# Patient Record
Sex: Female | Born: 1985 | Race: White | Hispanic: No | Marital: Married | Smoking: Current some day smoker
Health system: Southern US, Community
[De-identification: ages and names within clinical notes are randomized; demographics above are authoritative.]

## PROBLEM LIST (undated history)

## (undated) DIAGNOSIS — J189 Pneumonia, unspecified organism: Secondary | ICD-10-CM

## (undated) DIAGNOSIS — J45909 Unspecified asthma, uncomplicated: Secondary | ICD-10-CM

## (undated) HISTORY — PX: CERVICAL CERCLAGE: SHX1329

## (undated) HISTORY — PX: TONSILLECTOMY: SUR1361

## (undated) HISTORY — PX: OTHER SURGICAL HISTORY: SHX169

## (undated) HISTORY — PX: DILATION AND CURETTAGE OF UTERUS: SHX78

---
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
## 2005-12-06 ENCOUNTER — Emergency Department (HOSPITAL_COMMUNITY): Admission: EM | Admit: 2005-12-06 | Discharge: 2005-12-06 | Payer: Self-pay | Admitting: Emergency Medicine

## 2006-11-14 ENCOUNTER — Emergency Department (HOSPITAL_COMMUNITY): Admission: EM | Admit: 2006-11-14 | Discharge: 2006-11-15 | Payer: Self-pay | Admitting: Emergency Medicine

## 2007-11-18 IMAGING — CR DG CHEST 2V
2 series · 2 of 2 positions shown · non-contrast
Comparison: 12/06/05.

CLINICAL DATA: Cough.  Fever.  Upper respiratory infection.
 CHEST - 2 VIEW:

[view not recorded (1 of 2)]
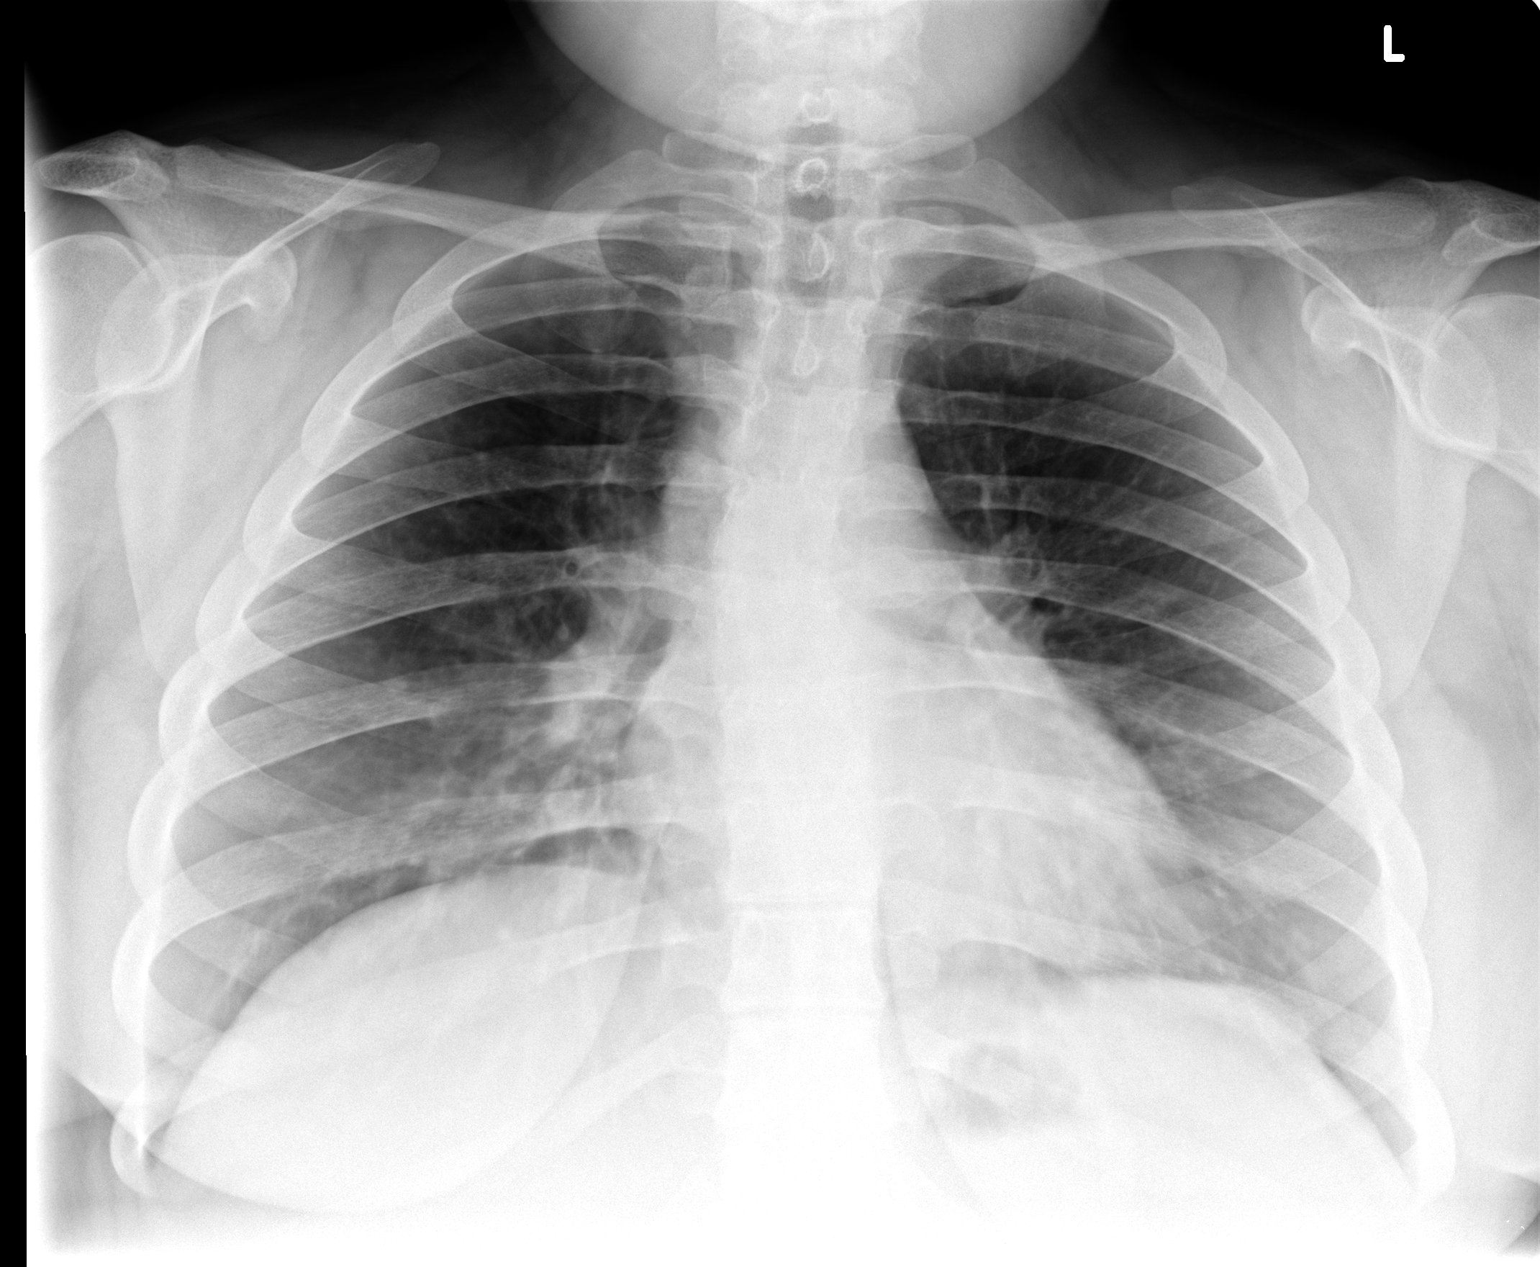

[view not recorded (2 of 2)]
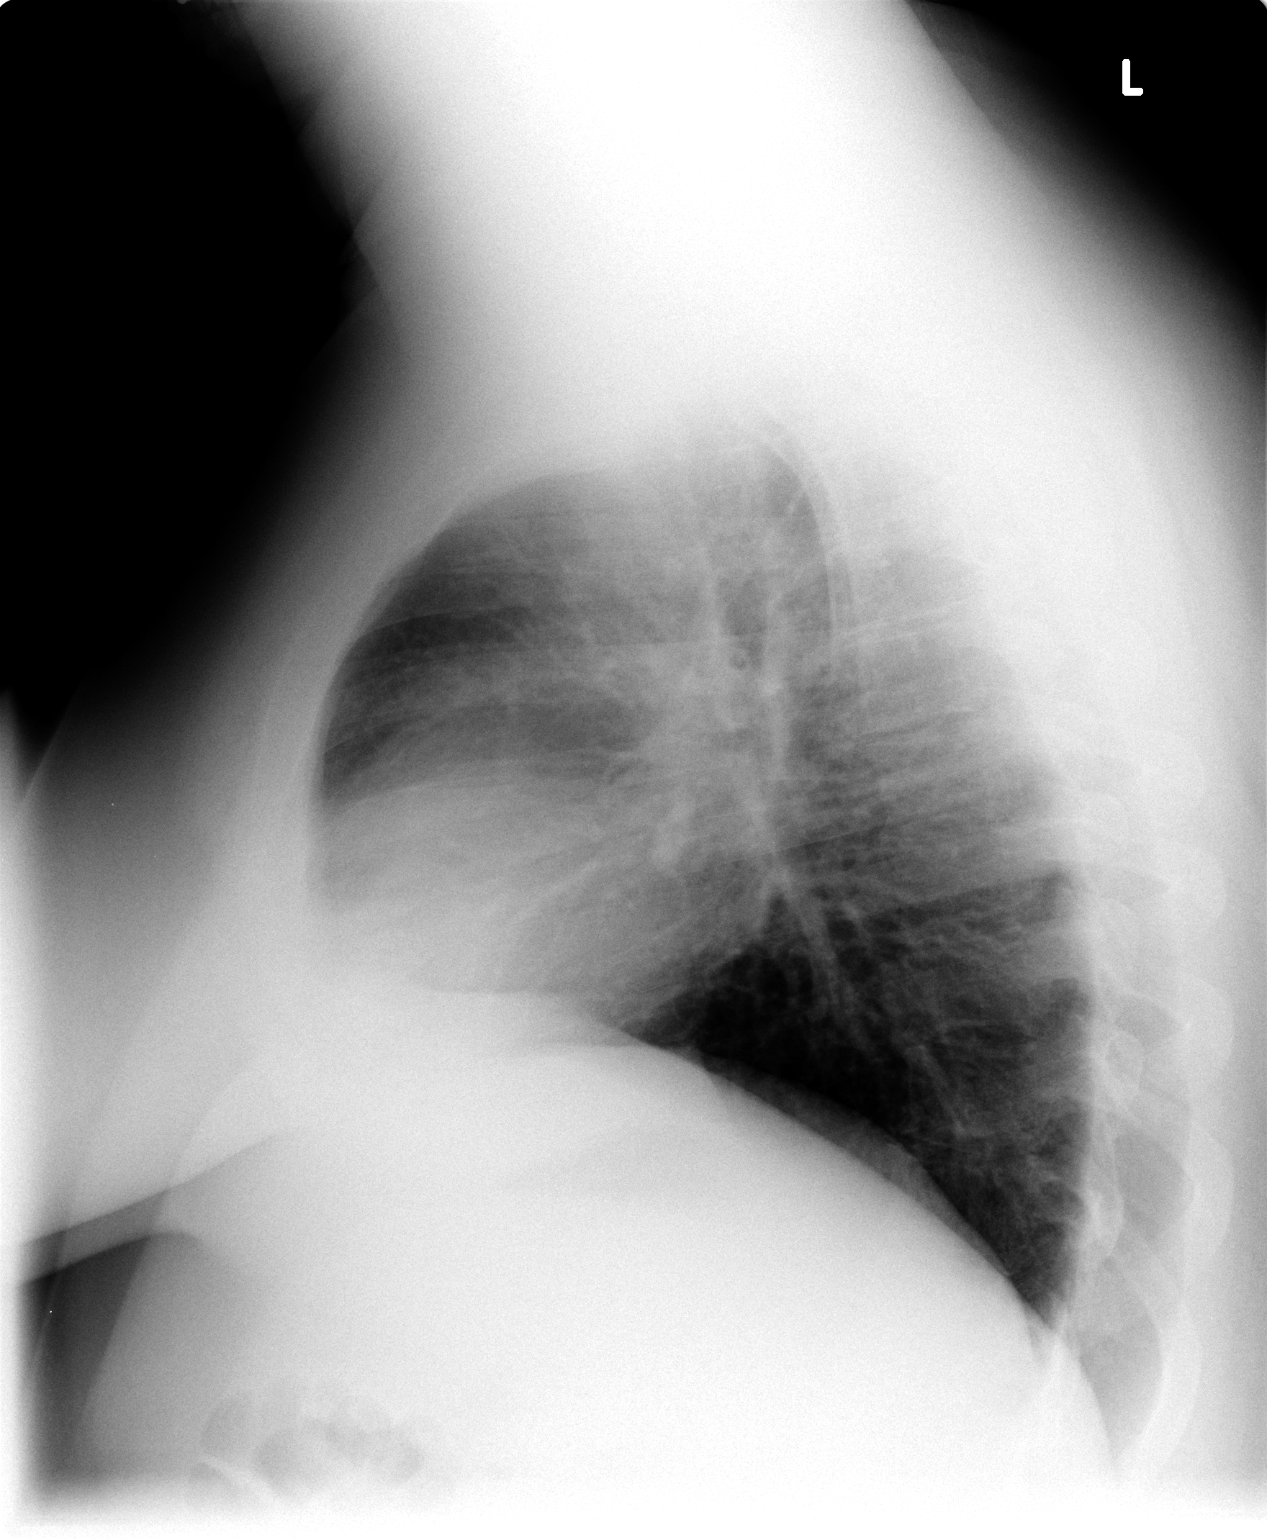

[2 of 2 positions shown; findings below may reference images not displayed]

FINDINGS: Central peribronchial thickening remains unchanged.  There is no evidence of acute infiltrate or pleural effusion.  Heart size and mediastinal contours are normal.
IMPRESSION: Chronic peribronchial thickening.  No active disease.

## 2012-10-02 ENCOUNTER — Emergency Department (HOSPITAL_COMMUNITY): Payer: BC Managed Care – PPO

## 2012-10-02 ENCOUNTER — Inpatient Hospital Stay (HOSPITAL_COMMUNITY)
Admission: EM | Admit: 2012-10-02 | Discharge: 2012-10-04 | DRG: 089 | Disposition: A | Discharge status: Home / Self Care | Payer: BC Managed Care – PPO | Attending: Internal Medicine | Admitting: Internal Medicine

## 2012-10-02 ENCOUNTER — Encounter (HOSPITAL_COMMUNITY): Payer: Self-pay | Admitting: *Deleted

## 2012-10-02 DIAGNOSIS — R0902 Hypoxemia: Secondary | ICD-10-CM | POA: Diagnosis present

## 2012-10-02 DIAGNOSIS — Z720 Tobacco use: Secondary | ICD-10-CM | POA: Diagnosis present

## 2012-10-02 DIAGNOSIS — E876 Hypokalemia: Secondary | ICD-10-CM | POA: Diagnosis present

## 2012-10-02 DIAGNOSIS — J45901 Unspecified asthma with (acute) exacerbation: Secondary | ICD-10-CM | POA: Diagnosis present

## 2012-10-02 DIAGNOSIS — F17200 Nicotine dependence, unspecified, uncomplicated: Secondary | ICD-10-CM | POA: Diagnosis present

## 2012-10-02 DIAGNOSIS — Z79899 Other long term (current) drug therapy: Secondary | ICD-10-CM

## 2012-10-02 DIAGNOSIS — J189 Pneumonia, unspecified organism: Principal | ICD-10-CM | POA: Diagnosis present

## 2012-10-02 HISTORY — DX: Unspecified asthma, uncomplicated: J45.909

## 2012-10-02 LAB — BASIC METABOLIC PANEL
BUN: 12 mg/dL (ref 6–23)
CO2: 26 mEq/L (ref 19–32)
Calcium: 9.6 mg/dL (ref 8.4–10.5)
Chloride: 97 mEq/L (ref 96–112)
Creatinine, Ser: 0.76 mg/dL (ref 0.50–1.10)
GFR calc Af Amer: >90 mL/min (ref >90)
GFR calc non Af Amer: >90 mL/min (ref >90)
Glucose, Bld: 162 mg/dL — ABNORMAL HIGH (ref 70–99)
Potassium: 3.2 mEq/L — ABNORMAL LOW (ref 3.5–5.1)
Sodium: 134 mEq/L — ABNORMAL LOW (ref 135–145)

## 2012-10-02 LAB — CBC WITH DIFFERENTIAL/PLATELET
Basophils Absolute: 0 10*3/uL (ref 0.0–0.1)
Basophils Relative: 0 % (ref 0–1)
Eosinophils Absolute: 0 10*3/uL (ref 0.0–0.7)
Eosinophils Relative: 1 % (ref 0–5)
HCT: 39.6 % (ref 36.0–46.0)
Hemoglobin: 13.7 g/dL (ref 12.0–15.0)
Lymphocytes Relative: 8 % — ABNORMAL LOW (ref 12–46)
Lymphs Abs: 0.7 10*3/uL (ref 0.7–4.0)
MCH: 31.2 pg (ref 26.0–34.0)
MCHC: 34.6 g/dL (ref 30.0–36.0)
MCV: 90.2 fL (ref 78.0–100.0)
Monocytes Absolute: 0.2 10*3/uL (ref 0.1–1.0)
Monocytes Relative: 2 % — ABNORMAL LOW (ref 3–12)
Neutro Abs: 7.7 10*3/uL (ref 1.7–7.7)
Neutrophils Relative %: 89 % — ABNORMAL HIGH (ref 43–77)
Platelets: 192 10*3/uL (ref 150–400)
RBC: 4.39 MIL/uL (ref 3.87–5.11)
RDW: 12.5 % (ref 11.5–15.5)
WBC: 8.6 10*3/uL (ref 4.0–10.5)

## 2012-10-02 MED ORDER — PREDNISONE 50 MG PO TABS
60.0000 mg | ORAL_TABLET | Freq: Once | ORAL | Status: AC
  Administered 2012-10-02: 60 mg via ORAL
  Filled 2012-10-02: qty 1

## 2012-10-02 MED ORDER — ALBUTEROL SULFATE (5 MG/ML) 0.5% IN NEBU: 2.5000 mg | INHALATION_SOLUTION | Freq: Once | RESPIRATORY_TRACT | Status: DC

## 2012-10-02 MED ORDER — SODIUM CHLORIDE 0.9 % IV BOLUS (SEPSIS)
1000.0000 mL | Freq: Once | INTRAVENOUS | Status: AC
  Administered 2012-10-02: 1000 mL via INTRAVENOUS

## 2012-10-02 MED ORDER — ONDANSETRON 4 MG PO TBDP
ORAL_TABLET | ORAL | Status: AC
  Administered 2012-10-02: 4 mg
  Filled 2012-10-02: qty 1

## 2012-10-02 MED ORDER — IPRATROPIUM BROMIDE 0.02 % IN SOLN
0.5000 mg | Freq: Once | RESPIRATORY_TRACT | Status: AC
  Administered 2012-10-02: 0.5 mg via RESPIRATORY_TRACT
  Filled 2012-10-02: qty 2.5

## 2012-10-02 MED ORDER — SODIUM CHLORIDE 0.9 % IV BOLUS (SEPSIS)
1000.0000 mL | Freq: Once | INTRAVENOUS | Status: DC
  Administered 2012-10-03: 1000 mL via INTRAVENOUS

## 2012-10-02 MED ORDER — PANTOPRAZOLE SODIUM 40 MG IV SOLR
40.0000 mg | Freq: Once | INTRAVENOUS | Status: AC
  Administered 2012-10-02: 40 mg via INTRAVENOUS
  Filled 2012-10-02: qty 40

## 2012-10-02 MED ORDER — AZITHROMYCIN 250 MG PO TABS
500.0000 mg | ORAL_TABLET | Freq: Once | ORAL | Status: AC
  Administered 2012-10-02: 500 mg via ORAL
  Filled 2012-10-02: qty 2

## 2012-10-02 MED ORDER — ALBUTEROL SULFATE (5 MG/ML) 0.5% IN NEBU
5.0000 mg | INHALATION_SOLUTION | Freq: Once | RESPIRATORY_TRACT | Status: AC
  Administered 2012-10-02: 5 mg via RESPIRATORY_TRACT
  Filled 2012-10-02: qty 1

## 2012-10-02 MED ORDER — DEXTROSE 5 % IV SOLN
1.0000 g | Freq: Once | INTRAVENOUS | Status: AC
  Administered 2012-10-02: 1 g via INTRAVENOUS
  Filled 2012-10-02: qty 10

## 2012-10-02 MED ORDER — ONDANSETRON HCL 4 MG/2ML IJ SOLN
4.0000 mg | Freq: Once | INTRAMUSCULAR | Status: AC
  Administered 2012-10-02: 4 mg via INTRAVENOUS
  Filled 2012-10-02: qty 2

## 2012-10-02 MED ORDER — ALBUTEROL SULFATE (5 MG/ML) 0.5% IN NEBU
10.0000 mg | INHALATION_SOLUTION | Freq: Once | RESPIRATORY_TRACT | Status: AC
  Administered 2012-10-02: 10 mg via RESPIRATORY_TRACT
  Filled 2012-10-02: qty 2

## 2012-10-02 NOTE — ED Notes (Signed)
Pt states nvd for over 1 week. Also notes cough and congestion for same period. Pt just finished 3rd nebulizer treatment and states moderate relief. Denies nausea at this time and state she in comfortable

## 2012-10-02 NOTE — ED Notes (Signed)
Vomiting, diarreha, cough, wheeze,  Has been using HHN  .

## 2012-10-02 NOTE — ED Notes (Signed)
Resp therapy contacted to give breathing treatment

## 2012-10-02 NOTE — ED Provider Notes (Signed)
History     CSN: [id number]  Arrival date & time 10/02/12  1511   First MD Initiated Contact with Patient 10/02/12 1740      Chief Complaint  Patient presents with  . Emesis     Patient is a 26 y.o. female presenting with cough. The history is provided by the patient.  Cough This is a new problem. Episode onset: several days ago. The problem occurs hourly. The problem has been gradually worsening. The cough is non-productive. Associated symptoms include chills, myalgias, shortness of breath and wheezing.  pt presents for cough, wheezing and shortness of breath She reports post tussive emesis She also reports nonbloody diarrhea as well No abd pain.   She reports chills associated with cough  Past Medical History  Diagnosis Date  . Asthma     Past Surgical History  Procedure Date  . Tonsillectomy   . Dilitation anc curet     History reviewed. No pertinent family history.  History  Substance Use Topics  . Smoking status: Former Games developer  . Smokeless tobacco: Not on file  . Alcohol Use: No    OB History    Grav Para Term Preterm Abortions TAB SAB Ect Mult Living                  Review of Systems  Constitutional: Positive for chills.  Respiratory: Positive for cough, shortness of breath and wheezing.   Gastrointestinal: Negative for abdominal pain.  Genitourinary: Negative for dysuria.  Musculoskeletal: Positive for myalgias.  Neurological: Negative for weakness.  Psychiatric/Behavioral: Negative for agitation.  All other systems reviewed and are negative.    Allergies  Review of patient's allergies indicates no known allergies.  Home Medications   Current Outpatient Rx  Name  Route  Sig  Dispense  Refill  . ALBUTEROL SULFATE (2.5 MG/3ML) 0.083% IN NEBU   Nebulization   Take 2.5 mg by nebulization every 6 (six) hours as needed. For cough and cold symptoms         . CLOMIPHENE CITRATE 50 MG PO TABS   Oral   Take 100 mg by mouth daily. On days 3-7  of menstrual cycle.         Marland Kitchen PHENYLEPH-CPM-DM-APAP 02-23-09-250 MG PO TBEF   Oral   Take 1 tablet by mouth daily as needed. For cold and flu symptoms           BP 129/87  Pulse 89  Temp 97.9 F (36.6 C) (Oral)  Resp 20  Ht 5\' 9"  (1.753 m)  Wt 225 lb (102.059 kg)  BMI 33.23 kg/m2  SpO2 92%  LMP 09/29/2012  Physical Exam CONSTITUTIONAL: Well developed/well nourished HEAD AND FACE: Normocephalic/atraumatic EYES: EOMI/PERRL, no icterus ENMT: Mucous membranes moist NECK: supple no meningeal signs SPINE:entire spine nontender CV: S1/S2 noted, no murmurs/rubs/gallops noted LUNGS: coarse wheezing noted bilaterally, but no distress and able to speak to me clearly ABDOMEN: soft, nontender, no rebound or guarding GU:no cva tenderness NEURO: Pt is awake/alert, moves all extremitiesx4 EXTREMITIES: pulses normal, full ROM SKIN: warm, color normal PSYCH: no abnormalities of mood noted  ED Course  Procedures   6:45 PM Primary issue appears to be wheezing/asthma.  Will treat with nebs/prednisone and reassess  9:32 PM Pt has had multiple treatments but still hypoxic (mid 80s) on room air She is in no distress, but will need further workup/nebs She still has diffuse coarse nebs on exam She has not had any vomiting/diarrhea here 9:50 PM At signout, will  need to have reassessment and check labs If no improvement with nebs, will need admission for pneumonia (community acquired)  MDM  Nursing notes including past medical history and social history reviewed and considered in documentation xrays reviewed and considered         Joya Gaskins, MD 10/02/12 2150

## 2012-10-03 ENCOUNTER — Encounter (HOSPITAL_COMMUNITY): Payer: Self-pay | Admitting: Internal Medicine

## 2012-10-03 DIAGNOSIS — F17200 Nicotine dependence, unspecified, uncomplicated: Secondary | ICD-10-CM

## 2012-10-03 DIAGNOSIS — J189 Pneumonia, unspecified organism: Secondary | ICD-10-CM | POA: Diagnosis present

## 2012-10-03 DIAGNOSIS — R0902 Hypoxemia: Secondary | ICD-10-CM | POA: Diagnosis present

## 2012-10-03 DIAGNOSIS — J45901 Unspecified asthma with (acute) exacerbation: Secondary | ICD-10-CM | POA: Diagnosis present

## 2012-10-03 DIAGNOSIS — Z720 Tobacco use: Secondary | ICD-10-CM | POA: Diagnosis present

## 2012-10-03 DIAGNOSIS — E876 Hypokalemia: Secondary | ICD-10-CM | POA: Diagnosis present

## 2012-10-03 LAB — GLUCOSE, CAPILLARY
Glucose-Capillary: 165 mg/dL — ABNORMAL HIGH (ref 70–99)
Glucose-Capillary: 155 mg/dL — ABNORMAL HIGH (ref 70–99)
Glucose-Capillary: 132 mg/dL — ABNORMAL HIGH (ref 70–99)

## 2012-10-03 LAB — COMPREHENSIVE METABOLIC PANEL
ALT: 10 U/L (ref 0–35)
AST: 11 U/L (ref 0–37)
Albumin: 3.3 g/dL — ABNORMAL LOW (ref 3.5–5.2)
Alkaline Phosphatase: 68 U/L (ref 39–117)
BUN: 12 mg/dL (ref 6–23)
CO2: 22 mEq/L (ref 19–32)
Calcium: 8.6 mg/dL (ref 8.4–10.5)
Chloride: 104 mEq/L (ref 96–112)
Creatinine, Ser: 0.65 mg/dL (ref 0.50–1.10)
GFR calc Af Amer: >90 mL/min (ref >90)
GFR calc non Af Amer: >90 mL/min (ref >90)
Glucose, Bld: 220 mg/dL — ABNORMAL HIGH (ref 70–99)
Potassium: 4.4 mEq/L (ref 3.5–5.1)
Sodium: 137 mEq/L (ref 135–145)
Total Bilirubin: 0.2 mg/dL — ABNORMAL LOW (ref 0.3–1.2)
Total Protein: 7.4 g/dL (ref 6.0–8.3)

## 2012-10-03 LAB — HIV ANTIBODY (ROUTINE TESTING W REFLEX): HIV: NONREACTIVE

## 2012-10-03 LAB — CBC
HCT: 36.6 % (ref 36.0–46.0)
Hemoglobin: 12.5 g/dL (ref 12.0–15.0)
MCH: 30.9 pg (ref 26.0–34.0)
MCHC: 34.2 g/dL (ref 30.0–36.0)
MCV: 90.4 fL (ref 78.0–100.0)
Platelets: 212 10*3/uL (ref 150–400)
RBC: 4.05 MIL/uL (ref 3.87–5.11)
RDW: 12.6 % (ref 11.5–15.5)
WBC: 8.7 10*3/uL (ref 4.0–10.5)

## 2012-10-03 LAB — MAGNESIUM: Magnesium: 1.8 mg/dL (ref 1.5–2.5)

## 2012-10-03 LAB — STREP PNEUMONIAE URINARY ANTIGEN: Strep Pneumo Urinary Antigen: NEGATIVE

## 2012-10-03 LAB — PREGNANCY, URINE: Preg Test, Ur: NEGATIVE

## 2012-10-03 MED ORDER — POTASSIUM CHLORIDE CRYS ER 20 MEQ PO TBCR
40.0000 meq | EXTENDED_RELEASE_TABLET | Freq: Once | ORAL | Status: AC
  Administered 2012-10-03: 40 meq via ORAL
  Filled 2012-10-03: qty 2

## 2012-10-03 MED ORDER — ALBUTEROL SULFATE (5 MG/ML) 0.5% IN NEBU: 2.5000 mg | INHALATION_SOLUTION | RESPIRATORY_TRACT | Status: DC | PRN

## 2012-10-03 MED ORDER — PNEUMOCOCCAL VAC POLYVALENT 25 MCG/0.5ML IJ INJ
0.5000 mL | INJECTION | INTRAMUSCULAR | Status: AC
  Administered 2012-10-04: 0.5 mL via INTRAMUSCULAR
  Filled 2012-10-03: qty 0.5

## 2012-10-03 MED ORDER — INFLUENZA VIRUS VACC SPLIT PF IM SUSP
0.5000 mL | INTRAMUSCULAR | Status: AC
  Administered 2012-10-04: 0.5 mL via INTRAMUSCULAR
  Filled 2012-10-03: qty 0.5

## 2012-10-03 MED ORDER — ENOXAPARIN SODIUM 40 MG/0.4ML ~~LOC~~ SOLN
40.0000 mg | SUBCUTANEOUS | Status: DC
  Administered 2012-10-03 – 2012-10-04 (×2): 40 mg via SUBCUTANEOUS
  Filled 2012-10-03 (×2): qty 0.4

## 2012-10-03 MED ORDER — DEXTROSE 5 % IV SOLN
1.0000 g | INTRAVENOUS | Status: DC
  Administered 2012-10-03: 1 g via INTRAVENOUS
  Filled 2012-10-03 (×2): qty 10

## 2012-10-03 MED ORDER — ONDANSETRON HCL 4 MG/2ML IJ SOLN
4.0000 mg | Freq: Four times a day (QID) | INTRAMUSCULAR | Status: DC | PRN
  Administered 2012-10-03: 4 mg via INTRAVENOUS
  Filled 2012-10-03: qty 2

## 2012-10-03 MED ORDER — OXYCODONE HCL 5 MG PO TABS
5.0000 mg | ORAL_TABLET | ORAL | Status: DC | PRN
  Administered 2012-10-03 (×2): 5 mg via ORAL
  Filled 2012-10-03 (×2): qty 1

## 2012-10-03 MED ORDER — NICOTINE 14 MG/24HR TD PT24
14.0000 mg | MEDICATED_PATCH | Freq: Every day | TRANSDERMAL | Status: DC
  Administered 2012-10-03: 14 mg via TRANSDERMAL
  Filled 2012-10-03 (×2): qty 1

## 2012-10-03 MED ORDER — ONDANSETRON HCL 4 MG PO TABS: 4.0000 mg | ORAL_TABLET | Freq: Four times a day (QID) | ORAL | Status: DC | PRN

## 2012-10-03 MED ORDER — METHYLPREDNISOLONE SODIUM SUCC 125 MG IJ SOLR
80.0000 mg | Freq: Two times a day (BID) | INTRAMUSCULAR | Status: DC
  Administered 2012-10-03 – 2012-10-04 (×2): 80 mg via INTRAVENOUS
  Filled 2012-10-03 (×2): qty 2

## 2012-10-03 MED ORDER — AZITHROMYCIN 500 MG IV SOLR
500.0000 mg | INTRAVENOUS | Status: DC
  Administered 2012-10-03: 500 mg via INTRAVENOUS
  Filled 2012-10-03 (×2): qty 500

## 2012-10-03 MED ORDER — ACETAMINOPHEN 650 MG RE SUPP: 650.0000 mg | Freq: Four times a day (QID) | RECTAL | Status: DC | PRN

## 2012-10-03 MED ORDER — POTASSIUM CHLORIDE IN NACL 20-0.9 MEQ/L-% IV SOLN
INTRAVENOUS | Status: AC
  Administered 2012-10-03: 02:00:00 via INTRAVENOUS

## 2012-10-03 MED ORDER — CLOMIPHENE CITRATE 50 MG PO TABS
50.0000 mg | ORAL_TABLET | ORAL | Status: DC
  Administered 2012-10-04: 50 mg via ORAL

## 2012-10-03 MED ORDER — ACETAMINOPHEN 325 MG PO TABS
650.0000 mg | ORAL_TABLET | Freq: Four times a day (QID) | ORAL | Status: DC | PRN
  Administered 2012-10-03: 650 mg via ORAL
  Filled 2012-10-03: qty 2

## 2012-10-03 MED ORDER — ALBUTEROL SULFATE (5 MG/ML) 0.5% IN NEBU
2.5000 mg | INHALATION_SOLUTION | RESPIRATORY_TRACT | Status: DC
  Administered 2012-10-03 – 2012-10-04 (×9): 2.5 mg via RESPIRATORY_TRACT
  Filled 2012-10-03 (×10): qty 0.5

## 2012-10-03 MED ORDER — IBUPROFEN 800 MG PO TABS
800.0000 mg | ORAL_TABLET | Freq: Once | ORAL | Status: AC
  Administered 2012-10-03: 800 mg via ORAL
  Filled 2012-10-03: qty 1

## 2012-10-03 MED ORDER — METHYLPREDNISOLONE SODIUM SUCC 125 MG IJ SOLR
80.0000 mg | Freq: Four times a day (QID) | INTRAMUSCULAR | Status: DC
  Administered 2012-10-03: 12:00:00 via INTRAVENOUS
  Administered 2012-10-03 (×2): 80 mg via INTRAVENOUS
  Filled 2012-10-03 (×3): qty 2

## 2012-10-03 MED ORDER — IBUPROFEN 600 MG PO TABS
600.0000 mg | ORAL_TABLET | Freq: Three times a day (TID) | ORAL | Status: DC | PRN
  Administered 2012-10-03 – 2012-10-04 (×2): 600 mg via ORAL
  Filled 2012-10-03 (×2): qty 1

## 2012-10-03 NOTE — ED Notes (Signed)
Dr Rito Ehrlich in room with patient at this time

## 2012-10-03 NOTE — Progress Notes (Signed)
UR Chart Review Completed  

## 2012-10-03 NOTE — Progress Notes (Signed)
Antibiotic doses reviewed for renal function; Pt is currently on Rocephin & Zithromax for CAP.  Neither of these antibiotics require renal dose adjustment. Pharmacy will sign off.  Please re-consult as needed.  Thank you, Junita Push, PharmD, BCPS

## 2012-10-03 NOTE — Progress Notes (Signed)
Inpatient Diabetes Program Recommendations  AACE/ADA: New Consensus Statement on Inpatient Glycemic Control (2013)  Target Ranges:  Prepandial:   less than 140 mg/dL      Peak postprandial:   less than 180 mg/dL (1-2 hours)      Critically ill patients:  140 - 180 mg/dL   Results for OVIDA, DELAGARZA (MRN [id number]) as of 10/03/2012 08:59  Ref. Range 10/02/2012 21:40 10/03/2012 05:53  Glucose Latest Range: 70-99 mg/dL 409 (H) 811 (H)    Inpatient Diabetes Program Recommendations Correction (SSI): Please consider checking CBGs ACHS and ordering Novolog moderate correction scale since patient is receiving IV steroids. HgbA1C: Please consider ordering an A1C on patient.  Patient has no history of diabetes but blood glucose is elevated likely due to steroids.  Would have better idea of average blood glucose prior to admission if A1C is obtained. Diet: May want to consider changing diet to carb modified.  Note: Patient does not have a documented history of diabetes. However, blood glucose has been elevated since being admitted which is likely due to steroids.  Please consider ordering an A1C and CBGs ACHS with Novolog moderate correction scale while patient is on steroids.  Will continue to follow.  Thanks, Orlando Penner, RN, BSN, CCRN Diabetes Coordinator Inpatient Diabetes Program [phone]

## 2012-10-03 NOTE — Progress Notes (Signed)
Nutrition Brief Note  Patient identified on the Malnutrition Screening Tool (MST) Report  Body mass index is 34.25 kg/(m^2). Pt meets criteria for Obesity Class II based on current BMI.   Current diet order is Regular, patient is consuming approximately 75-100% of meals at this time. Labs and medications reviewed.   No nutrition interventions warranted at this time. If nutrition issues arise, please consult RD.   #[phone]

## 2012-10-03 NOTE — H&P (Addendum)
[facility] History and Physical  Kristie Dixon UJW:[id number] DOB: 04/21/1986 DOA: 10/02/2012   PCP: Does not have a PCP Specialists: None  Chief Complaint: Shortness of breath, cough, and wheezing. For the last 5 days  HPI: Kristie Dixon is a 26 y.o. female with a past medical history of asthma BCD tobacco abuse, who was in her usual state of health till Thursday, when she started having cough and wheezing. Initially, it was whitish expectoration and currently, she has yellowish expectoration. The symptoms were also associated with chest pain, which would get worse with cough. The pain was 8/10 in intensity. She also felt like she had a fever on Sunday, but did not check her temperature. She also had chills. Denies any dizziness. Has been wheezing profusely and has been short of breath. And, then yesterday she had multiple episodes of diarrhea and vomiting, but none today. Because her symptoms were not getting any better she decided to come in to the hospital. She currently has her menstrual cycle. She used to be an albuterol inhaler and nebulizers. However, she has run out of them. Denies any sick contacts. No recent travel.  Home Medications: Prior to Admission medications   Medication Sig Start Date End Date Taking? Authorizing Provider  albuterol (PROVENTIL) (2.5 MG/3ML) 0.083% nebulizer solution Take 2.5 mg by nebulization every 6 (six) hours as needed. For cough and cold symptoms   Yes Historical Provider, MD  clomiPHENE (CLOMID) 50 MG tablet Take 100 mg by mouth daily. On days 3-7 of menstrual cycle.   Yes Historical Provider, MD  Phenyleph-CPM-DM-APAP (ALKA-SELTZER PLUS COLD & FLU) 02-23-09-250 MG TBEF Take 1 tablet by mouth daily as needed. For cold and flu symptoms   Yes Historical Provider, MD    Allergies: No Known Allergies  Past Medical History: Past Medical History  Diagnosis Date  . Asthma     Past Surgical History  Procedure Date  . Tonsillectomy   .  Dilitation anc curet   . Dilation and curettage of uterus     Social History:  reports that she has been smoking.  She does not have any smokeless tobacco history on file. She reports that she does not drink alcohol or use illicit drugs.  Living Situation: Lives with her husband Activity Level: Independent with her daily activities   Family History:  Family History  Problem Relation Age of Onset  . Depression Mother      Review of Systems - History obtained from the patient General ROS: positive for  - fatigue Psychological ROS: negative Ophthalmic ROS: negative ENT ROS: negative Allergy and Immunology ROS: negative Hematological and Lymphatic ROS: negative Endocrine ROS: negative Respiratory ROS: as in hpi Cardiovascular ROS: as in hpi Gastrointestinal ROS: as in hpi Genito-Urinary ROS: no dysuria, trouble voiding, or hematuria Musculoskeletal ROS: negative Neurological ROS: no TIA or stroke symptoms Dermatological ROS: negative  Physical Examination  Filed Vitals:   10/02/12 1901 10/02/12 2149 10/02/12 2200 10/02/12 2319  BP:  95/62    Pulse:  87  103  Temp:  98.3 F (36.8 C)    TempSrc:  Oral    Resp:  20  19  Height:      Weight:      SpO2: 93% 94% 96% 94%    General appearance: alert, cooperative, appears stated age, no distress and moderately obese Head: Normocephalic, without obvious abnormality, atraumatic Eyes: conjunctivae/corneas clear. PERRL, EOM's intact.  Throat: lips, mucosa, and tongue normal; teeth and gums normal Neck: no  adenopathy, no carotid bruit, no JVD, supple, symmetrical, trachea midline and thyroid not enlarged, symmetric, no tenderness/mass/nodules Back: symmetric, no curvature. ROM normal. No CVA tenderness. Resp: Diffuse wheezing bilaterally. Crackles present with a few rhonchi on the right side. Cardio: S1-S2 is mildly tachycardic, regular no S3, S4. No rubs, murmurs, or bruits. No pedal edema GI: soft, non-tender; bowel sounds  normal; no masses,  no organomegaly Extremities: extremities normal, atraumatic, no cyanosis or edema Pulses: 2+ and symmetric Skin: Skin color, texture, turgor normal. No rashes or lesions Lymph nodes: Cervical, supraclavicular, and axillary nodes normal. Neurologic: Alert and oriented X 3, normal strength and tone. Normal symmetric reflexes. Normal coordination and gait  Laboratory Data: Results for orders placed during the hospital encounter of 10/02/12 (from the past 48 hour(s))  BASIC METABOLIC PANEL     Status: Abnormal   Collection Time   10/02/12  9:40 PM      Component Value Range Comment   Sodium 134 (*) 135 - 145 mEq/L    Potassium 3.2 (*) 3.5 - 5.1 mEq/L    Chloride 97  96 - 112 mEq/L    CO2 26  19 - 32 mEq/L    Glucose, Bld 162 (*) 70 - 99 mg/dL    BUN 12  6 - 23 mg/dL    Creatinine, Ser 1.61  0.50 - 1.10 mg/dL    Calcium 9.6  8.4 - 09.6 mg/dL    GFR calc non Af Amer >90  >90 mL/min    GFR calc Af Amer >90  >90 mL/min   CBC WITH DIFFERENTIAL     Status: Abnormal   Collection Time   10/02/12  9:40 PM      Component Value Range Comment   WBC 8.6  4.0 - 10.5 K/uL    RBC 4.39  3.87 - 5.11 MIL/uL    Hemoglobin 13.7  12.0 - 15.0 g/dL    HCT 04.5  40.9 - 81.1 %    MCV 90.2  78.0 - 100.0 fL    MCH 31.2  26.0 - 34.0 pg    MCHC 34.6  30.0 - 36.0 g/dL    RDW 91.4  78.2 - 95.6 %    Platelets 192  150 - 400 K/uL    Neutrophils Relative 89 (*) 43 - 77 %    Neutro Abs 7.7  1.7 - 7.7 K/uL    Lymphocytes Relative 8 (*) 12 - 46 %    Lymphs Abs 0.7  0.7 - 4.0 K/uL    Monocytes Relative 2 (*) 3 - 12 %    Monocytes Absolute 0.2  0.1 - 1.0 K/uL    Eosinophils Relative 1  0 - 5 %    Eosinophils Absolute 0.0  0.0 - 0.7 K/uL    Basophils Relative 0  0 - 1 %    Basophils Absolute 0.0  0.0 - 0.1 K/uL     Radiology Reports: Dg Chest Port 1 View  10/02/2012  *RADIOLOGY REPORT*  Clinical Data: Short of breath.  Asthma.  PORTABLE CHEST - 1 VIEW  Comparison: 11/15/2006, 12/06/2005.   Findings: There is a new focus of opacity in the right upper lobe with overlying the right rib end.  While this could represent summation shadows, this is suspicious for a small focus of pneumonia.  Scarring is present in the medial right lung base, not appreciably changed compared to prior examinations.  The cardiopericardial silhouette appears within normal limits.  IMPRESSION:  1.  New right apical  density suspicious for small focus of pneumonia. 2.  Chronic medial right basilar scarring/atelectasis.   Original Report Authenticated By: Andreas Newport, M.D.     Problem List  Principal Problem:  *CAP (community acquired pneumonia) Active Problems:  Acute asthma exacerbation  Hypokalemia  Tobacco abuse  Hypoxia   Assessment: This is a 26 year old, Caucasian female, with a past medical history of asthma, who, unfortunately smokes one pack of cigarettes on a daily basis, who presents with a few day history of cough, wheezing, shortness of breath. She's found to have possible pneumonia in the right apex. She also has acute asthma exacerbation and has been hypoxic with room air saturations in 80's.  Plan: #1 community acquired pneumonia with hypoxia: She'll be treated with ceftriaxone and azithromycin. Oxygen will be provided.  #2 acute asthma exacerbation: Peak flows will be checked. She'll maintain on nebulizer treatments and steroids. CBGs will be monitored closely.  #3 tobacco abuse: Smoking cessation counseling will be provided. Nicotine patch will be utilized.  #4 hypokalemia: This will be repleted. Magnesium level will be checked.  We'll also check urine pregnancy test. Episodes of diarrhea and vomiting could be related to a viral syndrome. She doesn't have the symptoms currently. We'll monitor her for recurrence.  Further management decisions will depend on results of further testing and patient's response to treatment.  Code Status: She is a full code Family Communication: Her  mother was at the, bedside  Disposition Plan: Will likely return home in improved   [facility]  [facility] Pager [phone]  If 7PM-7AM, please contact night-coverage [email or web address] Password [email or web address]  10/03/2012, 12:08 AM

## 2012-10-03 NOTE — Progress Notes (Signed)
[facility]             Progress Note   Subjective: SOB has improved.  Objective: Vital signs in last 24 hours: Temp:  [97.7 F (36.5 C)-98.3 F (36.8 C)] 97.8 F (36.6 C) (12/10 1345) Pulse Rate:  [87-126] 98  (12/10 1345) Resp:  [19-26] 20  (12/10 1345) BP: (95-129)/(54-87) 115/54 mmHg (12/10 1345) SpO2:  [92 %-99 %] 95 % (12/10 1345) FiO2 (%):  [21 %] 21 % (12/10 1210) Weight:  [84.823 kg (187 lb)-105.2 kg (231 lb 14.8 oz)] 105.2 kg (231 lb 14.8 oz) (12/10 0110) Weight change:  Last BM Date: 10/01/12  Intake/Output from previous day: 12/09 0701 - 12/10 0700 In: 518.3 [I.V.:518.3] Out: -  Total I/O In: 480 [P.O.:480] Out: -    Physical Exam: General: Alert, awake, oriented x3. HEENT: No bruits, no goiter. Heart: Regular rate and rhythm, without murmurs, rubs, gallops. Lungs: Coarse bilateral breath sounds, no wheezing. Abdomen: Soft, nontender, nondistended, positive bowel sounds. Extremities: No clubbing cyanosis or edema with positive pedal pulses. Neuro: Grossly intact, nonfocal.    Lab Results: Basic Metabolic Panel:  Basename 10/03/12 0553 10/02/12 2140  NA 137 134*  K 4.4 3.2*  CL 104 97  CO2 22 26  GLUCOSE 220* 162*  BUN 12 12  CREATININE 0.65 0.76  CALCIUM 8.6 9.6  MG 1.8 --  PHOS -- --   Liver Function Tests:  [facility] 10/03/12 0553  AST 11  ALT 10  ALKPHOS 68  BILITOT 0.2*  PROT 7.4  ALBUMIN 3.3*   CBC:  Basename 10/03/12 0553 10/02/12 2140  WBC 8.7 8.6  NEUTROABS -- 7.7  HGB 12.5 13.7  HCT 36.6 39.6  MCV 90.4 90.2  PLT 212 192   CBG:  Basename 10/03/12 1119 10/03/12 0726  GLUCAP 155* 165*    Studies/Results: Dg Chest Port 1 View  10/02/2012  *RADIOLOGY REPORT*  Clinical Data: Short of breath.  Asthma.  PORTABLE CHEST - 1 VIEW  Comparison: 11/15/2006, 12/06/2005.  Findings: There is a new focus of opacity in the right upper lobe with overlying the right rib end.  While this could represent summation  shadows, this is suspicious for a small focus of pneumonia.  Scarring is present in the medial right lung base, not appreciably changed compared to prior examinations.  The cardiopericardial silhouette appears within normal limits.  IMPRESSION:  1.  New right apical density suspicious for small focus of pneumonia. 2.  Chronic medial right basilar scarring/atelectasis.   Original Report Authenticated By: Andreas Newport, M.D.     Medications: Scheduled Meds:   . [COMPLETED] albuterol  10 mg Nebulization Once  . albuterol  2.5 mg Nebulization Q4H  . [COMPLETED] albuterol  5 mg Nebulization Once  . [COMPLETED] albuterol  5 mg Nebulization Once  . azithromycin  500 mg Intravenous Q24H  . [COMPLETED] azithromycin  500 mg Oral Once  . [COMPLETED] cefTRIAXone (ROCEPHIN)  IV  1 g Intravenous Once  . cefTRIAXone (ROCEPHIN)  IV  1 g Intravenous Q24H  . clomiPHENE  50 mg Oral Q24H  . enoxaparin (LOVENOX) injection  40 mg Subcutaneous Q24H  . [COMPLETED] ibuprofen  800 mg Oral Once  . [COMPLETED] ipratropium  0.5 mg Nebulization Once  . methylPREDNISolone (SOLU-MEDROL) injection  80 mg Intravenous Q12H  . nicotine  14 mg Transdermal Daily  . [COMPLETED] ondansetron  4 mg Intravenous Once  . [COMPLETED] ondansetron      . [COMPLETED] pantoprazole (PROTONIX) IV  40 mg  Intravenous Once  . [COMPLETED] potassium chloride  40 mEq Oral Once  . [COMPLETED] predniSONE  60 mg Oral Once  . [COMPLETED] sodium chloride  1,000 mL Intravenous Once  . [COMPLETED] sodium chloride  1,000 mL Intravenous Once  . [DISCONTINUED] albuterol  2.5 mg Nebulization Once  . [DISCONTINUED] methylPREDNISolone (SOLU-MEDROL) injection  80 mg Intravenous Q6H  . [COMPLETED] sodium chloride  1,000 mL Intravenous Once   Continuous Infusions:   . [EXPIRED] 0.9 % NaCl with KCl 20 mEq / L 100 mL/hr at 10/03/12 0131   PRN Meds:.acetaminophen, acetaminophen, albuterol, ondansetron (ZOFRAN) IV, ondansetron,  oxyCODONE  Assessment/Plan:  Principal Problem:  *CAP (community acquired pneumonia) Active Problems:  Acute asthma exacerbation  Hypokalemia  Tobacco abuse  Hypoxia    CAP -Continue current antibiotics. -Can DC on levaquin or avelox for 7-10 days of treatment.  Asthma Exacerbation -Likely triggered by her PNA. -No wheezing today. -Will titrate steroids. -Will likely need a prednisone taper at time of DC and prescriptions for albuterol.  Tobacco Abuse -I have counseled her extensively.   Hypokalemia -Repleted.  Time spent coordinating care: 35 minutes.   LOS: 1 day   [facility] Pager: [phone] 10/03/2012, 3:15 PM

## 2012-10-03 NOTE — ED Notes (Signed)
Hospitalist wants preg urine before admission of patient. Pt in bathroom at this time

## 2012-10-03 NOTE — Care Management Note (Unsigned)
    Page 1 of 1   10/03/2012     3:38:31 PM   CARE MANAGEMENT NOTE 10/03/2012  Patient:  Kristie Dixon, Kristie Dixon   Account Number:  [id number]  Date Initiated:  10/03/2012  Documentation initiated by:  Rosemary Holms  Subjective/Objective Assessment:   Pt admitted from home where she lives with her spouse. Admitted with PNA with asthma exacerbation. Plan to DC home. No HH needs identified.     Action/Plan:   Anticipated DC Date:  10/04/2012   Anticipated DC Plan:  HOME/SELF CARE      DC Planning Services  CM consult      Choice offered to / List presented to:             Status of service:  In process, will continue to follow Medicare Important Message given?   (If response is "NO", the following Medicare IM given date fields will be blank) Date Medicare IM given:   Date Additional Medicare IM given:    Discharge Disposition:    Per UR Regulation:    If discussed at Long Length of Stay Meetings, dates discussed:    Comments:  10/03/12 Rosemary Holms RN BSN CM

## 2012-10-04 LAB — BASIC METABOLIC PANEL
BUN: 15 mg/dL (ref 6–23)
CO2: 23 mEq/L (ref 19–32)
Calcium: 8.9 mg/dL (ref 8.4–10.5)
Chloride: 105 mEq/L (ref 96–112)
Creatinine, Ser: 0.56 mg/dL (ref 0.50–1.10)
GFR calc Af Amer: >90 mL/min (ref >90)
GFR calc non Af Amer: >90 mL/min (ref >90)
Glucose, Bld: 133 mg/dL — ABNORMAL HIGH (ref 70–99)
Potassium: 4.7 mEq/L (ref 3.5–5.1)
Sodium: 137 mEq/L (ref 135–145)

## 2012-10-04 LAB — LEGIONELLA ANTIGEN, URINE: Legionella Antigen, Urine: NEGATIVE

## 2012-10-04 LAB — GLUCOSE, CAPILLARY
Glucose-Capillary: 120 mg/dL — ABNORMAL HIGH (ref 70–99)
Glucose-Capillary: 138 mg/dL — ABNORMAL HIGH (ref 70–99)

## 2012-10-04 LAB — CBC
HCT: 37.3 % (ref 36.0–46.0)
Hemoglobin: 12.6 g/dL (ref 12.0–15.0)
MCH: 31 pg (ref 26.0–34.0)
MCHC: 33.8 g/dL (ref 30.0–36.0)
MCV: 91.6 fL (ref 78.0–100.0)
Platelets: 225 10*3/uL (ref 150–400)
RBC: 4.07 MIL/uL (ref 3.87–5.11)
RDW: 12.8 % (ref 11.5–15.5)
WBC: 17.8 10*3/uL — ABNORMAL HIGH (ref 4.0–10.5)

## 2012-10-04 MED ORDER — PREDNISONE 10 MG PO TABS: ORAL_TABLET | ORAL | Status: DC

## 2012-10-04 MED ORDER — ALBUTEROL SULFATE HFA 108 (90 BASE) MCG/ACT IN AERS: 2.0000 | INHALATION_SPRAY | Freq: Four times a day (QID) | RESPIRATORY_TRACT | Status: AC | PRN

## 2012-10-04 MED ORDER — MOXIFLOXACIN HCL 400 MG PO TABS: 400.0000 mg | ORAL_TABLET | Freq: Every day | ORAL | Status: DC

## 2012-10-04 MED ORDER — ALBUTEROL SULFATE (2.5 MG/3ML) 0.083% IN NEBU: 2.5000 mg | INHALATION_SOLUTION | Freq: Four times a day (QID) | RESPIRATORY_TRACT | Status: AC | PRN

## 2012-10-04 NOTE — Progress Notes (Signed)
Patient with orders to be discharge home. Discharge instructions given, patient verbalized understanding via teach back method. Patient in stable condition upon discharge. Patient left with mom via private vehicle. Patient home medication returned to patient.

## 2012-10-04 NOTE — Discharge Summary (Signed)
Physician Discharge Summary  Kristie Dixon BMW:[id number] DOB: 1986-06-15 DOA: 10/02/2012  PCP: Ernestine Conrad, MD  Admit date: 10/02/2012 Discharge date: 10/04/2012  Time spent: 35 minutes  Recommendations for Outpatient Follow-up:  1. Follow up with primary care physician in 2 weeks  Discharge Diagnoses:  Principal Problem:  *CAP (community acquired pneumonia) Active Problems:  Acute asthma exacerbation  Hypokalemia  Tobacco abuse  Hypoxia   Discharge Condition: stable  Diet recommendation: regular diet  Filed Weights   10/02/12 1520 10/03/12 0053 10/03/12 0110  Weight: 102.059 kg (225 lb) 84.823 kg (187 lb) 105.2 kg (231 lb 14.8 oz)    History of present illness:  Kristie Dixon is a 26 y.o. female with a past medical history of asthma BCD tobacco abuse, who was in her usual state of health till Thursday, when she started having cough and wheezing. Initially, it was whitish expectoration and currently, she has yellowish expectoration. The symptoms were also associated with chest pain, which would get worse with cough. The pain was 8/10 in intensity. She also felt like she had a fever on Sunday, but did not check her temperature. She also had chills. Denies any dizziness. Has been wheezing profusely and has been short of breath. And, then yesterday she had multiple episodes of diarrhea and vomiting, but none today. Because her symptoms were not getting any better she decided to come in to the hospital. She currently has her menstrual cycle. She used to be an albuterol inhaler and nebulizers. However, she has run out of them. Denies any sick contacts. No recent travel.   Hospital Course:  This patient was admitted to the hospital for pneumonia and asthma exacerbation.  She was started on IV abx for CAP and steroids/nebs for asthma.  She quickly improved and is no longer wheezing or short of breath.  She is able to ambulate without difficulty and is requesting discharge home.  She  will be transitioned to prednisone taper and oral abx to complete a total of 7 days.  She is stable to discharge home.  Procedures:  none  Consultations:  none  Discharge Exam: Filed Vitals:   10/04/12 0723 10/04/12 0946 10/04/12 1236 10/04/12 1558  BP:  112/68  110/61  Pulse:  72  70  Temp:    98.2 F (36.8 C)  TempSrc:    Oral  Resp:    20  Height:      Weight:      SpO2: 97% 98% 98% 99%    General: NAD Cardiovascular: S1, S2, RRR Respiratory: CTA B  Discharge Instructions  Discharge Orders    Future Orders Please Complete By Expires   Diet general      Increase activity slowly      Call MD for:  temperature >100.4      Call MD for:  difficulty breathing, headache or visual disturbances          Medication List     As of 10/04/2012  4:15 PM    TAKE these medications         albuterol (2.5 MG/3ML) 0.083% nebulizer solution   Commonly known as: PROVENTIL   Take 3 mLs (2.5 mg total) by nebulization every 6 (six) hours as needed. For cough and cold symptoms      albuterol 108 (90 BASE) MCG/ACT inhaler   Commonly known as: PROVENTIL HFA;VENTOLIN HFA   Inhale 2 puffs into the lungs every 6 (six) hours as needed for wheezing.  ALKA-SELTZER PLUS COLD & FLU 02-23-09-250 MG Tbef   Generic drug: Phenyleph-CPM-DM-APAP   Take 1 tablet by mouth daily as needed. For cold and flu symptoms      clomiPHENE 50 MG tablet   Commonly known as: CLOMID   Take 100 mg by mouth daily. On days 3-7 of menstrual cycle.      moxifloxacin 400 MG tablet   Commonly known as: AVELOX   Take 1 tablet (400 mg total) by mouth daily.      predniSONE 10 MG tablet   Commonly known as: DELTASONE   Take 40mg  po daily for 2 days then 30mg  po daily for 2 days then 20mg  po daily for 2 days then 10mg  po daily for 2 days           Follow-up Information    Follow up with Ernestine Conrad, MD. Schedule an appointment as soon as possible for a visit in 2 weeks.   Contact information:   [facility] [address] [place] [address] [phone]           The results of significant diagnostics from this hospitalization (including imaging, microbiology, ancillary and laboratory) are listed below for reference.    Significant Diagnostic Studies: Dg Chest Port 1 View  10/02/2012  *RADIOLOGY REPORT*  Clinical Data: Short of breath.  Asthma.  PORTABLE CHEST - 1 VIEW  Comparison: 11/15/2006, 12/06/2005.  Findings: There is a new focus of opacity in the right upper lobe with overlying the right rib end.  While this could represent summation shadows, this is suspicious for a small focus of pneumonia.  Scarring is present in the medial right lung base, not appreciably changed compared to prior examinations.  The cardiopericardial silhouette appears within normal limits.  IMPRESSION:  1.  New right apical density suspicious for small focus of pneumonia. 2.  Chronic medial right basilar scarring/atelectasis.   Original Report Authenticated By: Andreas Newport, M.D.     Microbiology: No results found for this or any previous visit (from the past 240 hour(s)).   Labs: Basic Metabolic Panel:  Lab 10/04/12 0865 10/03/12 0553 10/02/12 2140  NA 137 137 134*  K 4.7 4.4 3.2*  CL 105 104 97  CO2 23 22 26   GLUCOSE 133* 220* 162*  BUN 15 12 12   CREATININE 0.56 0.65 0.76  CALCIUM 8.9 8.6 9.6  MG -- 1.8 --  PHOS -- -- --   Liver Function Tests:  Lab 10/03/12 0553  AST 11  ALT 10  ALKPHOS 68  BILITOT 0.2*  PROT 7.4  ALBUMIN 3.3*   No results found for this basename: LIPASE:5,AMYLASE:5 in the last 168 hours No results found for this basename: AMMONIA:5 in the last 168 hours CBC:  Lab 10/04/12 0601 10/03/12 0553 10/02/12 2140  WBC 17.8* 8.7 8.6  NEUTROABS -- -- 7.7  HGB 12.6 12.5 13.7  HCT 37.3 36.6 39.6  MCV 91.6 90.4 90.2  PLT 225 212 192   Cardiac Enzymes: No results found for this basename: CKTOTAL:5,CKMB:5,CKMBINDEX:5,TROPONINI:5 in the last 168  hours BNP: BNP (last 3 results) No results found for this basename: PROBNP:3 in the last 8760 hours CBG:  Lab 10/04/12 1108 10/04/12 0734 10/03/12 1618 10/03/12 1119 10/03/12 0726  GLUCAP 138* 120* 132* 155* 165*       Signed:  MEMON,JEHANZEB  [facility] 10/04/2012, 4:15 PM

## 2015-09-06 ENCOUNTER — Emergency Department (HOSPITAL_COMMUNITY): Payer: 59

## 2015-09-06 ENCOUNTER — Encounter (HOSPITAL_COMMUNITY): Payer: Self-pay | Admitting: Emergency Medicine

## 2015-09-06 ENCOUNTER — Emergency Department (HOSPITAL_COMMUNITY)
Admission: EM | Admit: 2015-09-06 | Discharge: 2015-09-06 | Disposition: A | Discharge status: Home / Self Care | Payer: 59 | Attending: Emergency Medicine | Admitting: Emergency Medicine

## 2015-09-06 DIAGNOSIS — Z720 Tobacco use: Secondary | ICD-10-CM | POA: Insufficient documentation

## 2015-09-06 DIAGNOSIS — R1110 Vomiting, unspecified: Secondary | ICD-10-CM | POA: Insufficient documentation

## 2015-09-06 DIAGNOSIS — Z792 Long term (current) use of antibiotics: Secondary | ICD-10-CM | POA: Diagnosis not present

## 2015-09-06 DIAGNOSIS — Z79899 Other long term (current) drug therapy: Secondary | ICD-10-CM | POA: Diagnosis not present

## 2015-09-06 DIAGNOSIS — J45901 Unspecified asthma with (acute) exacerbation: Secondary | ICD-10-CM | POA: Insufficient documentation

## 2015-09-06 DIAGNOSIS — Z8701 Personal history of pneumonia (recurrent): Secondary | ICD-10-CM | POA: Insufficient documentation

## 2015-09-06 DIAGNOSIS — R0602 Shortness of breath: Secondary | ICD-10-CM | POA: Diagnosis present

## 2015-09-06 DIAGNOSIS — J069 Acute upper respiratory infection, unspecified: Secondary | ICD-10-CM | POA: Diagnosis not present

## 2015-09-06 HISTORY — DX: Pneumonia, unspecified organism: J18.9

## 2015-09-06 MED ORDER — PREDNISONE 20 MG PO TABS: 60.0000 mg | ORAL_TABLET | Freq: Every day | ORAL | Status: AC

## 2015-09-06 MED ORDER — BENZONATATE 100 MG PO CAPS: 100.0000 mg | ORAL_CAPSULE | Freq: Three times a day (TID) | ORAL | Status: AC

## 2015-09-06 MED ORDER — DEXAMETHASONE SODIUM PHOSPHATE 4 MG/ML IJ SOLN
4.0000 mg | Freq: Once | INTRAMUSCULAR | Status: AC
  Administered 2015-09-06: 4 mg via INTRAVENOUS
  Filled 2015-09-06: qty 1

## 2015-09-06 MED ORDER — KETOROLAC TROMETHAMINE 30 MG/ML IJ SOLN
30.0000 mg | Freq: Once | INTRAMUSCULAR | Status: AC
  Administered 2015-09-06: 30 mg via INTRAVENOUS
  Filled 2015-09-06: qty 1

## 2015-09-06 MED ORDER — GUAIFENESIN ER 600 MG PO TB12: 600.0000 mg | ORAL_TABLET | Freq: Two times a day (BID) | ORAL | Status: AC

## 2015-09-06 MED ORDER — IPRATROPIUM-ALBUTEROL 0.5-2.5 (3) MG/3ML IN SOLN: 3.0000 mL | Freq: Once | RESPIRATORY_TRACT | Status: DC

## 2015-09-06 NOTE — Discharge Instructions (Signed)
You have an upper respiratory infection, likely due to a virus. You may continue using your nebulizer at home every four hours. Use the albuterol inhaler as needed. I will give you a short course of steroids. I will also give you a prescription for tessalon perles (cough medicine) and mucinex (an expectorant). Please follow-up with your primary care provider. Return to the ER for new or worsening symptoms.  Please obtain all of your results from medical records or have your doctors office obtain the results - share them with your doctor - you should be seen at your doctors office in the next 2 days. Call today to arrange your follow up. Take the medications as prescribed. Please review all of the medicines and only take them if you do not have an allergy to them. Please be aware that if you are taking birth control pills, taking other prescriptions, ESPECIALLY ANTIBIOTICS may make the birth control ineffective - if this is the case, either do not engage in sexual activity or use alternative methods of birth control such as condoms until you have finished the medicine and your family doctor says it is OK to restart them. If you are on a blood thinner such as COUMADIN, be aware that any other medicine that you take may cause the coumadin to either work too much, or not enough - you should have your coumadin level rechecked in next 7 days if this is the case.  ?  It is also a possibility that you have an allergic reaction to any of the medicines that you have been prescribed - Everybody reacts differently to medications and while MOST people have no trouble with most medicines, you may have a reaction such as nausea, vomiting, rash, swelling, shortness of breath. If this is the case, please stop taking the medicine immediately and contact your physician.  ?  You should return to the ER if you develop severe or worsening symptoms.

## 2015-09-06 NOTE — ED Provider Notes (Signed)
CSN: [id number]     Arrival date & time 09/06/15  1650 History   First MD Initiated Contact with Patient 09/06/15 1857     Chief Complaint  Patient presents with  . Shortness of Breath  . Cough  . Nasal Congestion  . Generalized Body Aches   HPI   Ms. Kristie Dixon is an 29 y.o. female with history of asthma who presents to the ED for evaluation of cough, SOB, and myalgias. She states her symptoms started four days ago. Reports that she has asthma at baseline and her cough has been exacerbating it so that she frequently has coughing spasms and shortness of breath. She has been requiring her nebulizer and rescue inhaler daily but states it does not improve her symptoms much. Also endorses some mild nausea and NBNB emesis associated with the coughing fits. She states that she had an extra z-pack at home so has been taking it for the past few days. She denies chest pain, fever, chills, abd pain. She does endorse flu shot this year. She is concerned as she has required hospitalization for pneumonia in the past.  Past Medical History  Diagnosis Date  . Asthma   . PNA (pneumonia)    Past Surgical History  Procedure Laterality Date  . Tonsillectomy    . Dilitation anc curet    . Dilation and curettage of uterus     Family History  Problem Relation Age of Onset  . Depression Mother    Social History  Substance Use Topics  . Smoking status: Current Every Day Smoker -- 1.00 packs/day  . Smokeless tobacco: None  . Alcohol Use: No   OB History    No data available     Review of Systems  All other systems reviewed and are negative.     Allergies  Review of patient's allergies indicates no known allergies.  Home Medications   Prior to Admission medications   Medication Sig Start Date End Date Taking? Authorizing Provider  albuterol (PROVENTIL HFA;VENTOLIN HFA) 108 (90 BASE) MCG/ACT inhaler Inhale 2 puffs into the lungs every 6 (six) hours as needed for wheezing. 10/04/12   Erick BlinksJehanzeb  Memon, MD  albuterol (PROVENTIL) (2.5 MG/3ML) 0.083% nebulizer solution Take 3 mLs (2.5 mg total) by nebulization every 6 (six) hours as needed. For cough and cold symptoms 10/04/12   Erick BlinksJehanzeb Memon, MD  benzonatate (TESSALON) 100 MG capsule Take 1 capsule (100 mg total) by mouth every 8 (eight) hours. 09/06/15   Carlene CoriaSerena Y Oral Remache, PA-C  clomiPHENE (CLOMID) 50 MG tablet Take 100 mg by mouth daily. On days 3-7 of menstrual cycle.    Historical Provider, MD  guaiFENesin (MUCINEX) 600 MG 12 hr tablet Take 1 tablet (600 mg total) by mouth 2 (two) times daily. 09/06/15   Ace GinsSerena Y Kenly Henckel, PA-C  moxifloxacin (AVELOX) 400 MG tablet Take 1 tablet (400 mg total) by mouth daily. 10/04/12   Erick BlinksJehanzeb Memon, MD  Phenyleph-CPM-DM-APAP (ALKA-SELTZER PLUS COLD & FLU) 02-23-09-250 MG TBEF Take 1 tablet by mouth daily as needed. For cold and flu symptoms    Historical Provider, MD  predniSONE (DELTASONE) 20 MG tablet Take 3 tablets (60 mg total) by mouth daily. 09/06/15   Ace GinsSerena Y Kush Farabee, PA-C   BP 126/80 mmHg  Pulse 80  Temp(Src) 97.5 F (36.4 C) (Oral)  Resp 20  Ht 5\' 7"  (1.702 m)  Wt 210 lb (95.255 kg)  BMI 32.88 kg/m2  SpO2 94%  LMP 08/26/2015 Physical Exam  Constitutional: She is oriented  to person, place, and time. No distress.  HENT:  Right Ear: External ear normal.  Left Ear: External ear normal.  Nose: Nose normal.  Mouth/Throat: Oropharynx is clear and moist. No oropharyngeal exudate.  Eyes: Conjunctivae and EOM are normal. Pupils are equal, round, and reactive to light.  Neck: Normal range of motion. Neck supple. No tracheal deviation present.  Cardiovascular: Normal rate, regular rhythm, normal heart sounds and intact distal pulses.   No murmur heard. Pulmonary/Chest: Effort normal and breath sounds normal. No respiratory distress.  Bilateral expiratory wheezing, upper airway congestion  Abdominal: Soft. Bowel sounds are normal. She exhibits no distension. There is no tenderness.  Musculoskeletal:  Normal range of motion. She exhibits no edema or tenderness.  Lymphadenopathy:    She has no cervical adenopathy.  Neurological: She is alert and oriented to person, place, and time. No cranial nerve deficit.  Skin: Skin is warm and dry. She is not diaphoretic.  Psychiatric: She has a normal mood and affect.  Nursing note and vitals reviewed.   ED Course  Procedures (including critical care time) Labs Review Labs Reviewed - No data to display  Imaging Review Dg Chest 2 View  09/06/2015  CLINICAL DATA:  Shortness of breath and productive cough for 4 days. Initial encounter. EXAM: CHEST  2 VIEW COMPARISON:  PA and lateral chest 11/15/2006. FINDINGS: Heart size and mediastinal contours are within normal limits. Both lungs are clear. Visualized skeletal structures are unremarkable. IMPRESSION: Negative exam. Electronically Signed   By: Drusilla Kanner M.D.   On: 09/06/2015 19:49   I have personally reviewed and evaluated these images and lab results as part of my medical decision-making.   EKG Interpretation None      MDM   Final diagnoses:  Upper respiratory infection    CXR negative. Initially was concerned about possible pneumonia and began basic labs but given normal CXR cancelled those labs. Gave pt one duoneb and decadron IM here. She reports mild improvement in symptoms. Her lung sounds are improved following neb but continues to have a few soft wheezes. Discussed with pt I think she has a viral illness. Will send home with rx for prednisone, mucinex, and tessalon. She may continue her duoneb q4h at home and albuterol prn. VSS. Return precautions given.     Carlene Coria, PA-C 09/06/15 2022  Eber Hong, MD 09/07/15 [id number]

## 2015-09-06 NOTE — ED Provider Notes (Signed)
The patient is a 29 year old female, she has a history of asthma, who has been having wheezing, she has been coughing, she has been having muscle aches and body aches and a headache. She did get a flu shot a couple of months ago, on exam she has mild diffuse wheezing, coughs occasionally, clear speech, clear oropharynx, soft abdomen, no edema, vital signs unremarkable. X-ray shows no signs of infiltrate, likely viral syndrome, no need for blood work, stable for discharge with prednisone and bronchodilators.  Medical screening examination/treatment/procedure(s) were conducted as a shared visit with non-physician practitioner(s) and myself.  I personally evaluated the patient during the encounter.  Clinical Impression:   Final diagnoses:  Upper respiratory infection         Eber HongBrian Haytham Maher, MD 09/07/15 [phone]

## 2015-09-06 NOTE — ED Notes (Signed)
Pt c/o shortness of breath, cough, congestion, and generalized body aches x four days.

## 2016-06-14 ENCOUNTER — Other Ambulatory Visit (HOSPITAL_COMMUNITY): Payer: Self-pay | Admitting: Unknown Physician Specialty

## 2016-06-15 ENCOUNTER — Encounter (HOSPITAL_COMMUNITY): Payer: Self-pay | Admitting: *Deleted

## 2016-06-15 ENCOUNTER — Ambulatory Visit (HOSPITAL_COMMUNITY)
Admission: RE | Admit: 2016-06-15 | Discharge: 2016-06-15 | Disposition: A | Discharge status: Home / Self Care | Payer: 59 | Source: Ambulatory Visit | Attending: Unknown Physician Specialty | Admitting: Unknown Physician Specialty

## 2016-06-15 VITALS — BP 129/78 | HR 96 | Wt 252.8 lb

## 2016-06-15 DIAGNOSIS — J45909 Unspecified asthma, uncomplicated: Secondary | ICD-10-CM | POA: Insufficient documentation

## 2016-06-15 DIAGNOSIS — O26872 Cervical shortening, second trimester: Secondary | ICD-10-CM | POA: Insufficient documentation

## 2016-06-15 DIAGNOSIS — O99332 Smoking (tobacco) complicating pregnancy, second trimester: Secondary | ICD-10-CM | POA: Diagnosis not present

## 2016-06-15 DIAGNOSIS — F17210 Nicotine dependence, cigarettes, uncomplicated: Secondary | ICD-10-CM | POA: Diagnosis not present

## 2016-06-15 DIAGNOSIS — Z3A21 21 weeks gestation of pregnancy: Secondary | ICD-10-CM | POA: Insufficient documentation

## 2016-06-15 DIAGNOSIS — O99512 Diseases of the respiratory system complicating pregnancy, second trimester: Secondary | ICD-10-CM | POA: Insufficient documentation

## 2016-06-15 DIAGNOSIS — O26879 Cervical shortening, unspecified trimester: Secondary | ICD-10-CM

## 2016-06-15 NOTE — Consult Note (Signed)
Maternal Fetal Medicine Consultation  Requesting Provider(s): Ernestina PennaNigel Buist, MD  Reason for consultation: Cervical shortening  HPI: Kristie Dixon is a 30 yo G2P0010, EDD 10/22/2016 who is currently at 21w 4d seen for consultation due to cervical shortening that was picked up on routine anatomy scan.  On initial evaluation, the cervical length was 1.8 cm.  She was started on vaginal progesterone.  [facility] (images reviewed) showed a cervical length of 7 mm with V-shaped funneling.  She denies cramping/ vaginal bleeding or discharge and is otherwise without complaints.  Her past OB history is remarkable for a prior 8 week SAB requiring a D&C.  OB History: OB History    Gravida Para Term Preterm AB Living   2       1     SAB TAB Ectopic Multiple Live Births   1              PMH:  Past Medical History:  Diagnosis Date  . Asthma   . PNA (pneumonia)     PSH:  Past Surgical History:  Procedure Laterality Date  . DILATION AND CURETTAGE OF UTERUS    . dilitation anc curet    . TONSILLECTOMY     Meds:  Current Outpatient Prescriptions on File Prior to Encounter  Medication Sig Dispense Refill  . albuterol (PROVENTIL HFA;VENTOLIN HFA) 108 (90 BASE) MCG/ACT inhaler Inhale 2 puffs into the lungs every 6 (six) hours as needed for wheezing. 1 Inhaler 2  . albuterol (PROVENTIL) (2.5 MG/3ML) 0.083% nebulizer solution Take 3 mLs (2.5 mg total) by nebulization every 6 (six) hours as needed. For cough and cold symptoms 75 mL 1  . benzonatate (TESSALON) 100 MG capsule Take 1 capsule (100 mg total) by mouth every 8 (eight) hours. (Patient not taking: Reported on 06/15/2016) 21 capsule 0  . guaiFENesin (MUCINEX) 600 MG 12 hr tablet Take 1 tablet (600 mg total) by mouth 2 (two) times daily. (Patient not taking: Reported on 06/15/2016) 20 tablet 0  . letrozole (FEMARA) 2.5 MG tablet Take 5 mg by mouth daily. Take only on days 3 through 7 of menstrual cycle    .  predniSONE (DELTASONE) 20 MG tablet Take 3 tablets (60 mg total) by mouth daily. (Patient not taking: Reported on 06/15/2016) 9 tablet 0   No current facility-administered medications on file prior to encounter.    Allergies: No Known Allergies   FH:  Family History  Problem Relation Age of Onset  . Depression Mother    Soc:  Social History   Social History  . Marital status: Married    Spouse name: N/A  . Number of children: N/A  . Years of education: N/A   Occupational History  . Not on file.   Social History Main Topics  . Smoking status: Current Some Day Smoker    Packs/day: 1.00  . Smokeless tobacco: Never Used  . Alcohol use No  . Drug use: No  . Sexual activity: Yes    Birth control/ protection: None   Other Topics Concern  . Not on file   Social History Narrative  . No narrative on file    Review of Systems: no vaginal bleeding or cramping/contractions, no LOF, no nausea/vomiting. All other systems reviewed and are negative.   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  [facility].   Given findings, would offer ultrasound indicated cerclage.  We reviewed the risks and benefits of the proposed procedure. She is aware that there is a risks of preterm / previable delivery despite cerclage and reviewed other risks to include pain, bleeding, infection, ruptured membranes.     Discussed case with Dr. Donzetta MattersGalloway - they would [facility] and do not feel comfortable doing the procedure in [place].  I contacted Dr. Gavin PottersGrandis who is currently on service.  He recommended that she [facility] with planned admission overnight and likely cerclage tomorrow  morning.   We will see the patient for a follow up cervical length in one week.   Thank you for the opportunity to be a part of the care of Kristie Dixon. Please contact our office if we can be of further assistance.   I spent approximately 30 minutes with this patient with over 50% of time spent in face-to-face counseling.  Alpha GulaPaul Nilani Hugill, MD Maternal Fetal Medicine

## 2016-06-22 ENCOUNTER — Ambulatory Visit (HOSPITAL_COMMUNITY): Payer: 59

## 2016-06-29 ENCOUNTER — Encounter (HOSPITAL_COMMUNITY): Payer: Self-pay

## 2016-06-29 ENCOUNTER — Ambulatory Visit (HOSPITAL_COMMUNITY)
Admission: RE | Admit: 2016-06-29 | Discharge: 2016-06-29 | Disposition: A | Discharge status: Home / Self Care | Payer: 59 | Source: Ambulatory Visit | Attending: Maternal and Fetal Medicine | Admitting: Maternal and Fetal Medicine

## 2016-06-29 DIAGNOSIS — Z3A23 23 weeks gestation of pregnancy: Secondary | ICD-10-CM | POA: Diagnosis not present

## 2016-06-29 DIAGNOSIS — O26879 Cervical shortening, unspecified trimester: Secondary | ICD-10-CM

## 2016-06-29 DIAGNOSIS — O26872 Cervical shortening, second trimester: Secondary | ICD-10-CM | POA: Insufficient documentation

## 2016-06-29 DIAGNOSIS — O3432 Maternal care for cervical incompetence, second trimester: Secondary | ICD-10-CM | POA: Diagnosis not present

## 2016-06-30 ENCOUNTER — Other Ambulatory Visit (HOSPITAL_COMMUNITY): Payer: Self-pay | Admitting: *Deleted

## 2016-06-30 DIAGNOSIS — O3430 Maternal care for cervical incompetence, unspecified trimester: Secondary | ICD-10-CM

## 2016-07-13 ENCOUNTER — Encounter (HOSPITAL_COMMUNITY): Payer: Self-pay

## 2016-07-13 ENCOUNTER — Ambulatory Visit (HOSPITAL_COMMUNITY): Payer: 59

## 2016-12-24 DIAGNOSIS — J45909 Unspecified asthma, uncomplicated: Secondary | ICD-10-CM | POA: Diagnosis not present

## 2016-12-24 DIAGNOSIS — E1165 Type 2 diabetes mellitus with hyperglycemia: Secondary | ICD-10-CM | POA: Diagnosis not present

## 2016-12-24 DIAGNOSIS — Z720 Tobacco use: Secondary | ICD-10-CM | POA: Diagnosis not present

## 2017-04-20 ENCOUNTER — Encounter (HOSPITAL_COMMUNITY): Payer: Self-pay

## 2017-07-02 IMAGING — US US MFM OB TRANSVAGINAL
1 series · 15 of 28 positions shown · non-contrast
Comparison: none

[Series 1: us mfm ob transvaginal · 47 acquisitions, 15 frames shown]
[im 1/47]
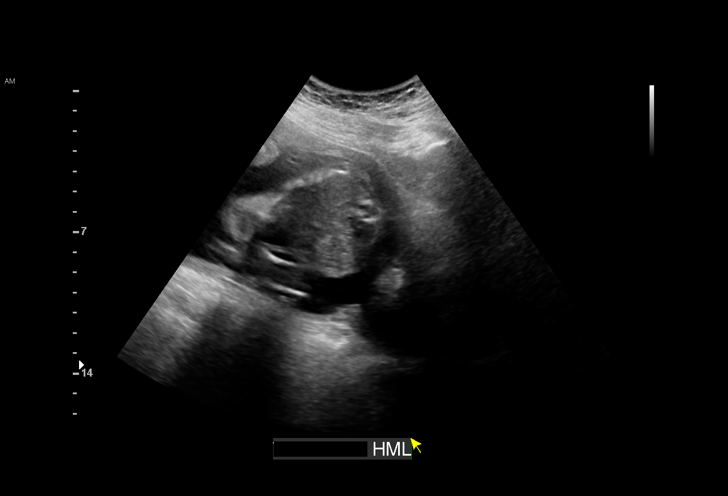
[im 4/47]
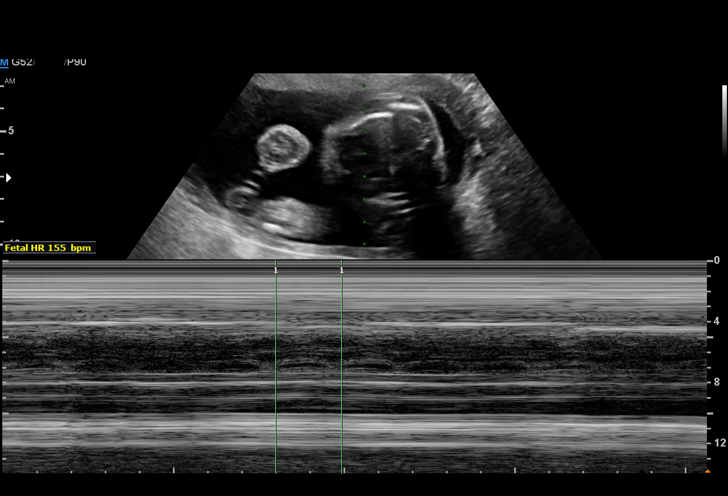
[im 7/47]
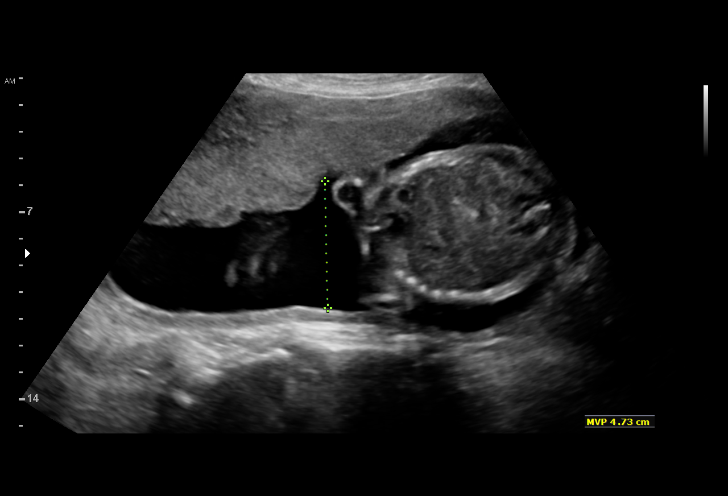
[im 11/47]
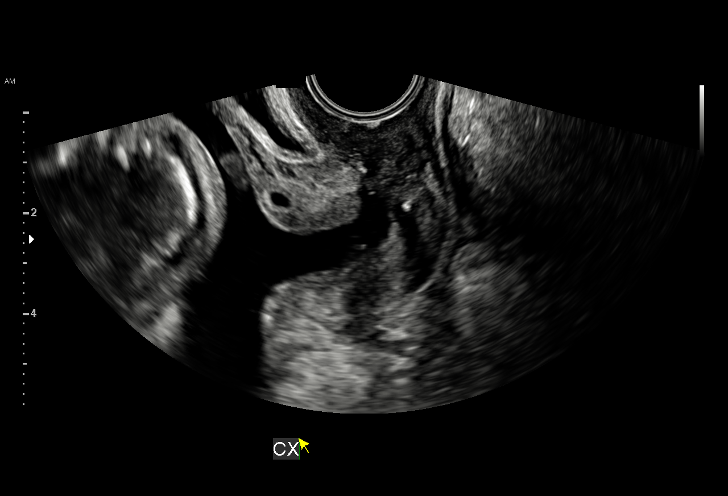
[im 14/47]
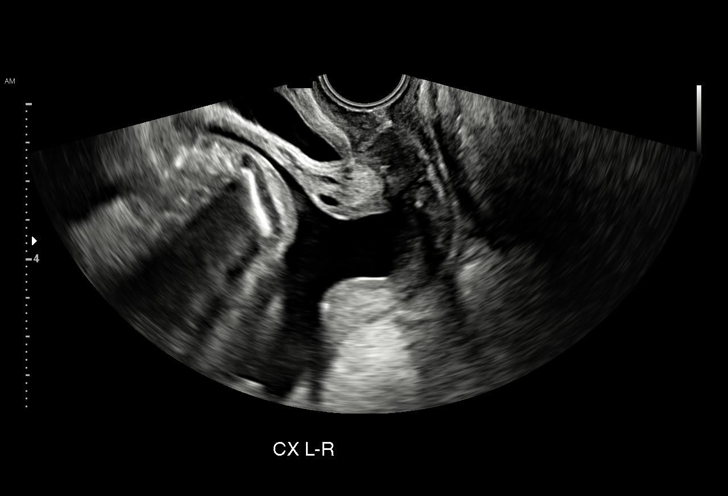
[im 18/47]
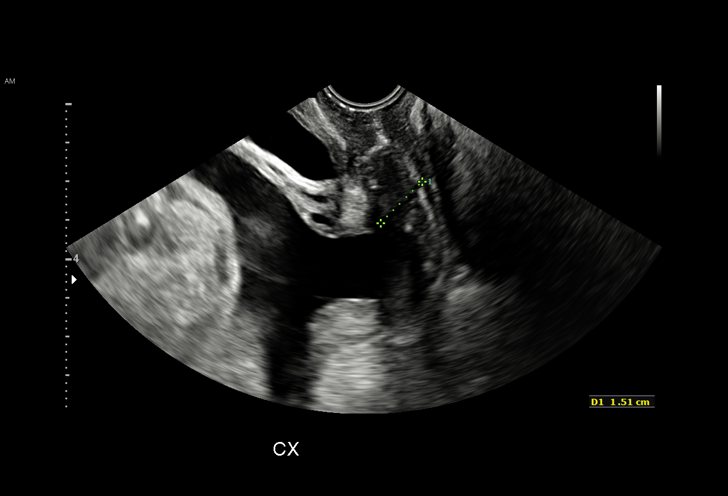
[im 21/47]
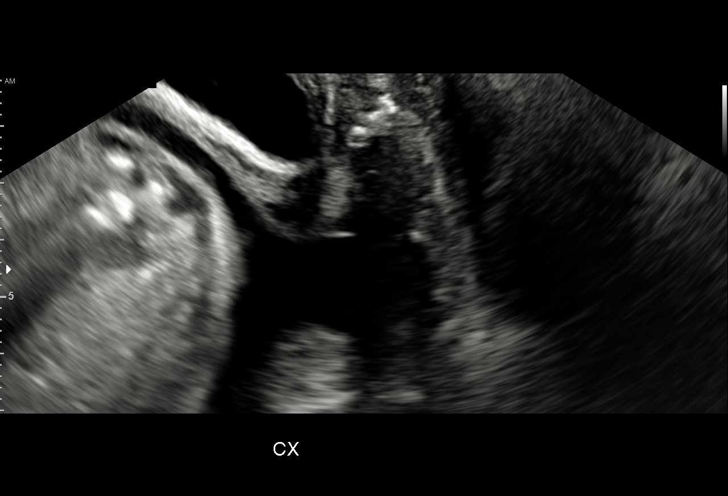
[im 24/47]
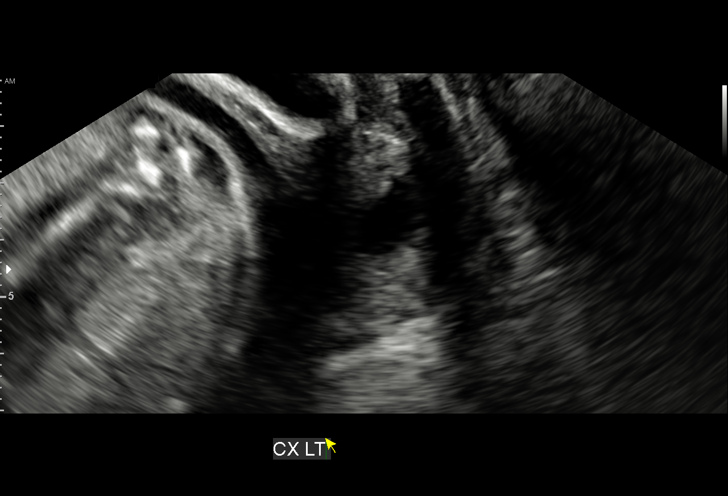
[im 26/47]
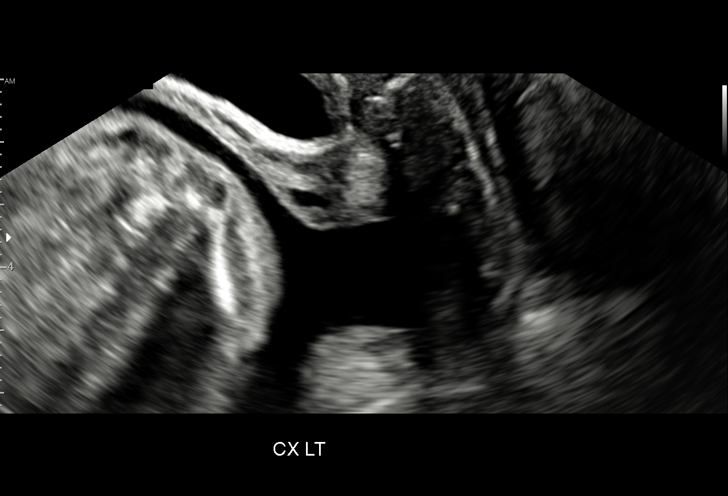
[im 29/47]
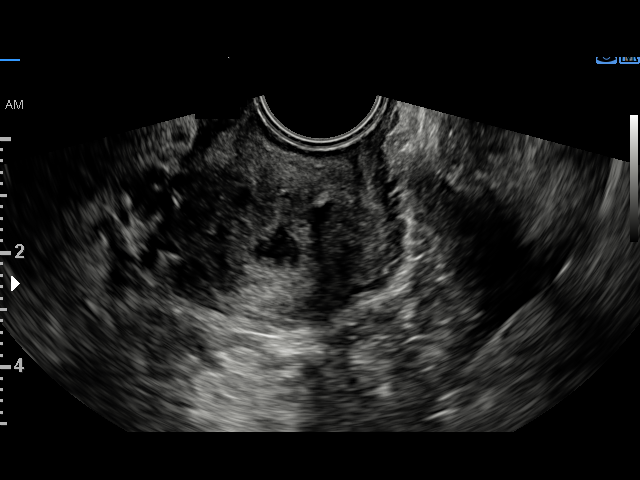
[im 33/47]
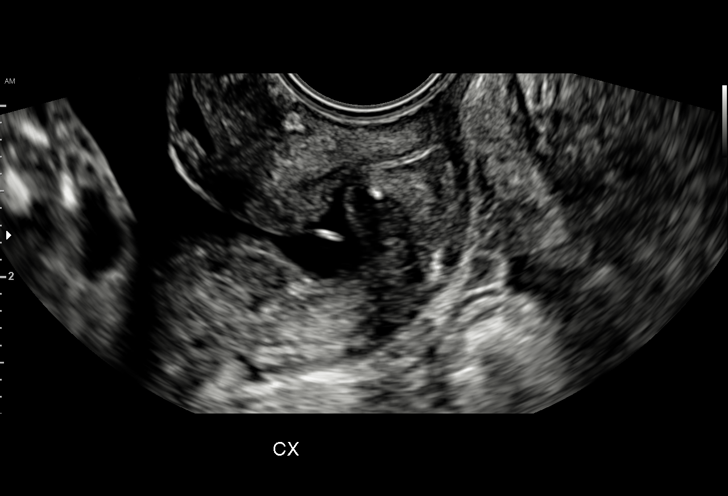
[im 36/47]
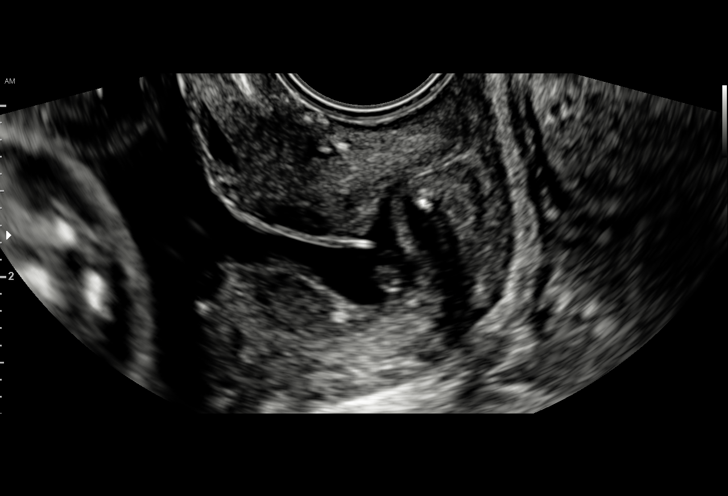
[im 40/47]
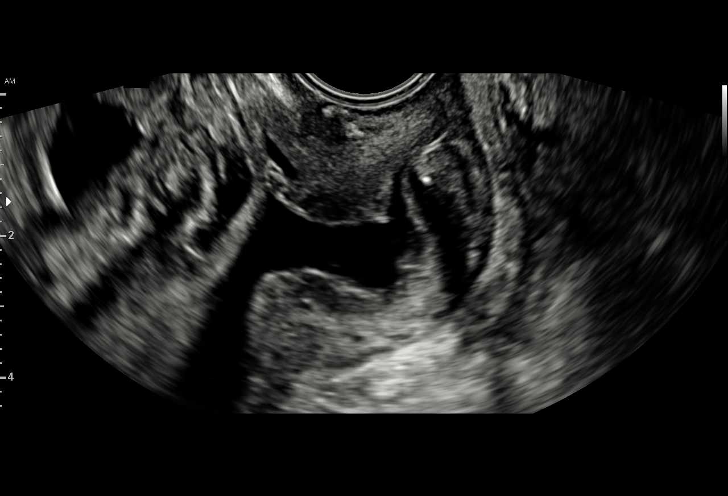
[im 43/47]
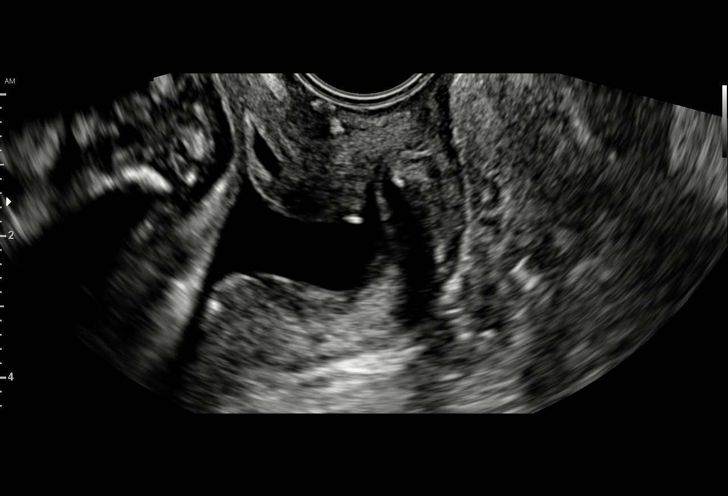
[im 47/47]
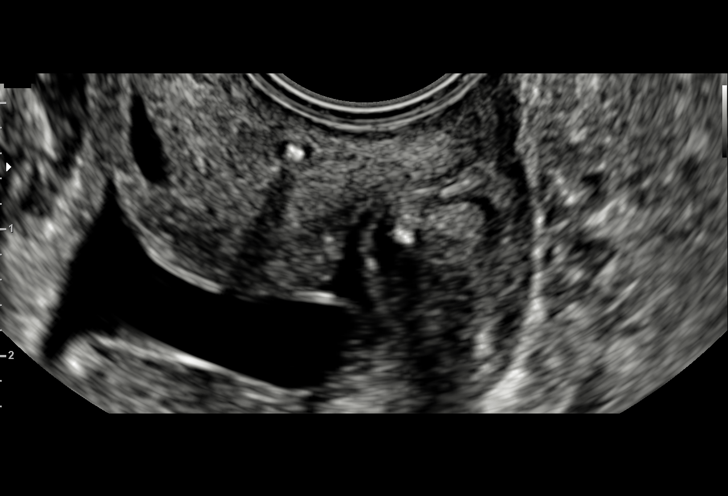

[15 of 28 positions shown; findings below may reference images not displayed]

1  ABIMELK TIGER            [id number]      [id number]     [id number]
Indications

23 weeks gestation of pregnancy; S/P BMZ
Cervical shortening, second trimester
Cervical cerclage suture present, second
trimester
OB History

Gravidity:    2         Term:   0        Prem:   0         SAB:   1
TOP:          0       Ectopic:  0        Living: 0
Fetal Evaluation

Num Of Fetuses:     1
Fetal Heart         155
Rate(bpm):
Cardiac Activity:   Observed
Presentation:       Variable

Amniotic Fluid
AFI FV:      Subjectively within normal limits

Largest Pocket(cm)
4.7
Gestational Age

LMP:           22w 4d        Date:  01/23/16                 EDD:    10/29/16
Best:          23w 4d     Det. By:  Early Ultrasound         EDD:    10/22/16
(04/07/16)
Cervix Uterus Adnexa
Cervix
Length:            0.9  cm.
Measured transvaginally.
Impression

SIUP at 23+4 weeks
Normal amniotic fluid volume
EV views of cervix: funneling down to the cerclage; distal
closed portion measured 
 9 mms
Recommendations

US for CL at [facility] in one week
US for CL and possible growth here in 2 weeks

## 2017-09-05 DIAGNOSIS — F17210 Nicotine dependence, cigarettes, uncomplicated: Secondary | ICD-10-CM | POA: Diagnosis not present

## 2017-09-05 DIAGNOSIS — K210 Gastro-esophageal reflux disease with esophagitis: Secondary | ICD-10-CM | POA: Diagnosis not present

## 2017-09-05 DIAGNOSIS — F331 Major depressive disorder, recurrent, moderate: Secondary | ICD-10-CM | POA: Diagnosis not present

## 2017-09-05 DIAGNOSIS — K219 Gastro-esophageal reflux disease without esophagitis: Secondary | ICD-10-CM | POA: Diagnosis not present

## 2017-09-05 DIAGNOSIS — E6609 Other obesity due to excess calories: Secondary | ICD-10-CM | POA: Diagnosis not present

## 2017-09-08 DIAGNOSIS — R1011 Right upper quadrant pain: Secondary | ICD-10-CM | POA: Diagnosis not present

## 2017-09-08 DIAGNOSIS — R109 Unspecified abdominal pain: Secondary | ICD-10-CM | POA: Diagnosis not present

## 2017-09-14 DIAGNOSIS — Z6836 Body mass index (BMI) 36.0-36.9, adult: Secondary | ICD-10-CM | POA: Diagnosis not present

## 2017-09-14 DIAGNOSIS — J4521 Mild intermittent asthma with (acute) exacerbation: Secondary | ICD-10-CM | POA: Diagnosis not present

## 2017-09-21 DIAGNOSIS — R1011 Right upper quadrant pain: Secondary | ICD-10-CM | POA: Diagnosis not present

## 2017-09-21 DIAGNOSIS — R109 Unspecified abdominal pain: Secondary | ICD-10-CM | POA: Diagnosis not present

## 2017-09-21 DIAGNOSIS — R112 Nausea with vomiting, unspecified: Secondary | ICD-10-CM | POA: Diagnosis not present

## 2017-11-16 DIAGNOSIS — J4521 Mild intermittent asthma with (acute) exacerbation: Secondary | ICD-10-CM | POA: Diagnosis not present

## 2018-02-27 DIAGNOSIS — M5431 Sciatica, right side: Secondary | ICD-10-CM | POA: Diagnosis not present

## 2019-02-20 DIAGNOSIS — F331 Major depressive disorder, recurrent, moderate: Secondary | ICD-10-CM | POA: Diagnosis not present

## 2019-02-20 DIAGNOSIS — F17210 Nicotine dependence, cigarettes, uncomplicated: Secondary | ICD-10-CM | POA: Diagnosis not present

## 2019-02-20 DIAGNOSIS — Z1331 Encounter for screening for depression: Secondary | ICD-10-CM | POA: Diagnosis not present

## 2019-02-20 DIAGNOSIS — K219 Gastro-esophageal reflux disease without esophagitis: Secondary | ICD-10-CM | POA: Diagnosis not present

## 2019-02-20 DIAGNOSIS — J301 Allergic rhinitis due to pollen: Secondary | ICD-10-CM | POA: Diagnosis not present

## 2019-12-21 DIAGNOSIS — Z6832 Body mass index (BMI) 32.0-32.9, adult: Secondary | ICD-10-CM | POA: Diagnosis not present

## 2019-12-21 DIAGNOSIS — Z113 Encounter for screening for infections with a predominantly sexual mode of transmission: Secondary | ICD-10-CM | POA: Diagnosis not present

## 2019-12-21 DIAGNOSIS — Z114 Encounter for screening for human immunodeficiency virus [HIV]: Secondary | ICD-10-CM | POA: Diagnosis not present

## 2019-12-21 DIAGNOSIS — Z01419 Encounter for gynecological examination (general) (routine) without abnormal findings: Secondary | ICD-10-CM | POA: Diagnosis not present

## 2020-01-23 DIAGNOSIS — F419 Anxiety disorder, unspecified: Secondary | ICD-10-CM | POA: Diagnosis not present

## 2020-02-04 DIAGNOSIS — K219 Gastro-esophageal reflux disease without esophagitis: Secondary | ICD-10-CM | POA: Diagnosis not present

## 2020-02-04 DIAGNOSIS — F17210 Nicotine dependence, cigarettes, uncomplicated: Secondary | ICD-10-CM | POA: Diagnosis not present

## 2020-02-04 DIAGNOSIS — J301 Allergic rhinitis due to pollen: Secondary | ICD-10-CM | POA: Diagnosis not present

## 2020-02-04 DIAGNOSIS — F331 Major depressive disorder, recurrent, moderate: Secondary | ICD-10-CM | POA: Diagnosis not present

## 2020-03-12 DIAGNOSIS — M545 Low back pain: Secondary | ICD-10-CM | POA: Diagnosis not present

## 2020-03-12 DIAGNOSIS — K219 Gastro-esophageal reflux disease without esophagitis: Secondary | ICD-10-CM | POA: Diagnosis not present

## 2020-03-12 DIAGNOSIS — F17210 Nicotine dependence, cigarettes, uncomplicated: Secondary | ICD-10-CM | POA: Diagnosis not present

## 2020-03-12 DIAGNOSIS — J301 Allergic rhinitis due to pollen: Secondary | ICD-10-CM | POA: Diagnosis not present

## 2020-03-12 DIAGNOSIS — J4521 Mild intermittent asthma with (acute) exacerbation: Secondary | ICD-10-CM | POA: Diagnosis not present

## 2020-03-12 DIAGNOSIS — F331 Major depressive disorder, recurrent, moderate: Secondary | ICD-10-CM | POA: Diagnosis not present

## 2020-04-09 DIAGNOSIS — H527 Unspecified disorder of refraction: Secondary | ICD-10-CM | POA: Diagnosis not present

## 2020-05-06 DIAGNOSIS — R1110 Vomiting, unspecified: Secondary | ICD-10-CM | POA: Diagnosis not present

## 2020-05-06 DIAGNOSIS — Z20828 Contact with and (suspected) exposure to other viral communicable diseases: Secondary | ICD-10-CM | POA: Diagnosis not present

## 2020-05-21 DIAGNOSIS — J0101 Acute recurrent maxillary sinusitis: Secondary | ICD-10-CM | POA: Diagnosis not present

## 2020-05-21 DIAGNOSIS — J4521 Mild intermittent asthma with (acute) exacerbation: Secondary | ICD-10-CM | POA: Diagnosis not present

## 2020-05-21 DIAGNOSIS — F17210 Nicotine dependence, cigarettes, uncomplicated: Secondary | ICD-10-CM | POA: Diagnosis not present

## 2024-04-16 ENCOUNTER — Institutional Professional Consult (permissible substitution) (INDEPENDENT_AMBULATORY_CARE_PROVIDER_SITE_OTHER): Payer: Self-pay | Admitting: Adult Health
